# Patient Record
Sex: Male | Born: 1935 | Race: White | Hispanic: No | Smoking: Never smoker
Health system: Southern US, Community
[De-identification: ages and names within clinical notes are randomized; demographics above are authoritative.]

## PROBLEM LIST (undated history)

## (undated) DIAGNOSIS — I2510 Atherosclerotic heart disease of native coronary artery without angina pectoris: Secondary | ICD-10-CM

## (undated) DIAGNOSIS — E119 Type 2 diabetes mellitus without complications: Secondary | ICD-10-CM

## (undated) DIAGNOSIS — I10 Essential (primary) hypertension: Secondary | ICD-10-CM

## (undated) DIAGNOSIS — K219 Gastro-esophageal reflux disease without esophagitis: Secondary | ICD-10-CM

## (undated) DIAGNOSIS — E079 Disorder of thyroid, unspecified: Secondary | ICD-10-CM

## (undated) HISTORY — PX: SHOULDER SURGERY: SHX246

## (undated) HISTORY — PX: TONSILLECTOMY: SUR1361

## (undated) HISTORY — PX: NASAL SINUS SURGERY: SHX719

---

## 2015-05-31 ENCOUNTER — Emergency Department (HOSPITAL_BASED_OUTPATIENT_CLINIC_OR_DEPARTMENT_OTHER): Payer: No Typology Code available for payment source

## 2015-05-31 ENCOUNTER — Encounter (HOSPITAL_BASED_OUTPATIENT_CLINIC_OR_DEPARTMENT_OTHER): Payer: Self-pay | Admitting: Emergency Medicine

## 2015-05-31 ENCOUNTER — Emergency Department (HOSPITAL_BASED_OUTPATIENT_CLINIC_OR_DEPARTMENT_OTHER)
Admission: EM | Admit: 2015-05-31 | Discharge: 2015-05-31 | Disposition: A | Discharge status: Home / Self Care | Payer: No Typology Code available for payment source | Attending: Emergency Medicine | Admitting: Emergency Medicine

## 2015-05-31 DIAGNOSIS — E119 Type 2 diabetes mellitus without complications: Secondary | ICD-10-CM | POA: Insufficient documentation

## 2015-05-31 DIAGNOSIS — S0083XA Contusion of other part of head, initial encounter: Secondary | ICD-10-CM | POA: Diagnosis not present

## 2015-05-31 DIAGNOSIS — S0081XA Abrasion of other part of head, initial encounter: Secondary | ICD-10-CM | POA: Insufficient documentation

## 2015-05-31 DIAGNOSIS — I2510 Atherosclerotic heart disease of native coronary artery without angina pectoris: Secondary | ICD-10-CM | POA: Diagnosis not present

## 2015-05-31 DIAGNOSIS — Z7982 Long term (current) use of aspirin: Secondary | ICD-10-CM | POA: Diagnosis not present

## 2015-05-31 DIAGNOSIS — M7989 Other specified soft tissue disorders: Secondary | ICD-10-CM | POA: Insufficient documentation

## 2015-05-31 DIAGNOSIS — Z7901 Long term (current) use of anticoagulants: Secondary | ICD-10-CM | POA: Insufficient documentation

## 2015-05-31 DIAGNOSIS — S161XXA Strain of muscle, fascia and tendon at neck level, initial encounter: Secondary | ICD-10-CM | POA: Insufficient documentation

## 2015-05-31 DIAGNOSIS — Y998 Other external cause status: Secondary | ICD-10-CM | POA: Insufficient documentation

## 2015-05-31 DIAGNOSIS — S3992XA Unspecified injury of lower back, initial encounter: Secondary | ICD-10-CM | POA: Diagnosis present

## 2015-05-31 DIAGNOSIS — S39012A Strain of muscle, fascia and tendon of lower back, initial encounter: Secondary | ICD-10-CM | POA: Diagnosis not present

## 2015-05-31 DIAGNOSIS — E079 Disorder of thyroid, unspecified: Secondary | ICD-10-CM | POA: Diagnosis not present

## 2015-05-31 DIAGNOSIS — S0990XA Unspecified injury of head, initial encounter: Secondary | ICD-10-CM | POA: Diagnosis not present

## 2015-05-31 DIAGNOSIS — Y9389 Activity, other specified: Secondary | ICD-10-CM | POA: Insufficient documentation

## 2015-05-31 DIAGNOSIS — Y92410 Unspecified street and highway as the place of occurrence of the external cause: Secondary | ICD-10-CM | POA: Diagnosis not present

## 2015-05-31 DIAGNOSIS — I10 Essential (primary) hypertension: Secondary | ICD-10-CM | POA: Diagnosis not present

## 2015-05-31 DIAGNOSIS — Z79899 Other long term (current) drug therapy: Secondary | ICD-10-CM | POA: Diagnosis not present

## 2015-05-31 HISTORY — DX: Atherosclerotic heart disease of native coronary artery without angina pectoris: I25.10

## 2015-05-31 HISTORY — DX: Disorder of thyroid, unspecified: E07.9

## 2015-05-31 HISTORY — DX: Essential (primary) hypertension: I10

## 2015-05-31 HISTORY — DX: Type 2 diabetes mellitus without complications: E11.9

## 2015-05-31 MED ORDER — TRAMADOL HCL 50 MG PO TABS: 50.0000 mg | ORAL_TABLET | Freq: Four times a day (QID) | ORAL | Status: DC | PRN

## 2015-05-31 NOTE — Discharge Instructions (Signed)
Take pain medicine as directed. As we discuss CT scan of head and neck without any acute findings. X-rays of the low back with evidence of a compression fracture whether new or old is not clear however you're not tender over that bone so may very well be old. He continued to have trouble in that area CT scan of the low back could be done by her primary care doctor or by us. Return for any new or worse symptoms.
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
## 2017-02-21 ENCOUNTER — Emergency Department (HOSPITAL_COMMUNITY)
Admission: EM | Admit: 2017-02-21 | Discharge: 2017-02-21 | Disposition: A | Discharge status: Home / Self Care | Payer: Medicare Other | Attending: Emergency Medicine | Admitting: Emergency Medicine

## 2017-02-21 ENCOUNTER — Emergency Department (HOSPITAL_COMMUNITY): Payer: Medicare Other

## 2017-02-21 ENCOUNTER — Encounter (HOSPITAL_COMMUNITY): Payer: Self-pay | Admitting: Emergency Medicine

## 2017-02-21 DIAGNOSIS — R339 Retention of urine, unspecified: Secondary | ICD-10-CM

## 2017-02-21 DIAGNOSIS — Z23 Encounter for immunization: Secondary | ICD-10-CM | POA: Diagnosis not present

## 2017-02-21 DIAGNOSIS — R0789 Other chest pain: Secondary | ICD-10-CM | POA: Diagnosis not present

## 2017-02-21 DIAGNOSIS — R51 Headache: Secondary | ICD-10-CM | POA: Insufficient documentation

## 2017-02-21 DIAGNOSIS — W0110XA Fall on same level from slipping, tripping and stumbling with subsequent striking against unspecified object, initial encounter: Secondary | ICD-10-CM | POA: Insufficient documentation

## 2017-02-21 DIAGNOSIS — E119 Type 2 diabetes mellitus without complications: Secondary | ICD-10-CM | POA: Diagnosis not present

## 2017-02-21 DIAGNOSIS — I2510 Atherosclerotic heart disease of native coronary artery without angina pectoris: Secondary | ICD-10-CM | POA: Diagnosis not present

## 2017-02-21 DIAGNOSIS — Y92012 Bathroom of single-family (private) house as the place of occurrence of the external cause: Secondary | ICD-10-CM | POA: Diagnosis not present

## 2017-02-21 DIAGNOSIS — R109 Unspecified abdominal pain: Secondary | ICD-10-CM | POA: Diagnosis not present

## 2017-02-21 DIAGNOSIS — Z7901 Long term (current) use of anticoagulants: Secondary | ICD-10-CM | POA: Insufficient documentation

## 2017-02-21 DIAGNOSIS — S022XXB Fracture of nasal bones, initial encounter for open fracture: Secondary | ICD-10-CM | POA: Diagnosis not present

## 2017-02-21 DIAGNOSIS — Y939 Activity, unspecified: Secondary | ICD-10-CM | POA: Insufficient documentation

## 2017-02-21 DIAGNOSIS — S12121A Other nondisplaced dens fracture, initial encounter for closed fracture: Secondary | ICD-10-CM | POA: Diagnosis not present

## 2017-02-21 DIAGNOSIS — Z7982 Long term (current) use of aspirin: Secondary | ICD-10-CM | POA: Insufficient documentation

## 2017-02-21 DIAGNOSIS — I10 Essential (primary) hypertension: Secondary | ICD-10-CM | POA: Insufficient documentation

## 2017-02-21 DIAGNOSIS — Z79899 Other long term (current) drug therapy: Secondary | ICD-10-CM | POA: Insufficient documentation

## 2017-02-21 DIAGNOSIS — W19XXXA Unspecified fall, initial encounter: Secondary | ICD-10-CM

## 2017-02-21 DIAGNOSIS — Y999 Unspecified external cause status: Secondary | ICD-10-CM | POA: Insufficient documentation

## 2017-02-21 DIAGNOSIS — S0992XA Unspecified injury of nose, initial encounter: Secondary | ICD-10-CM | POA: Diagnosis present

## 2017-02-21 DIAGNOSIS — S12100A Unspecified displaced fracture of second cervical vertebra, initial encounter for closed fracture: Secondary | ICD-10-CM

## 2017-02-21 LAB — COMPREHENSIVE METABOLIC PANEL
ALBUMIN: 3.9 g/dL (ref 3.5–5.0)
ALT: 29 U/L (ref 17–63)
ANION GAP: 4 — AB (ref 5–15)
AST: 36 U/L (ref 15–41)
Alkaline Phosphatase: 87 U/L (ref 38–126)
BUN: 15 mg/dL (ref 6–20)
CHLORIDE: 95 mmol/L — AB (ref 101–111)
CO2: 32 mmol/L (ref 22–32)
CREATININE: 1.21 mg/dL (ref 0.61–1.24)
Calcium: 9.5 mg/dL (ref 8.9–10.3)
GFR calc non Af Amer: 52 mL/min — ABNORMAL LOW (ref >60)
Glucose, Bld: 197 mg/dL — ABNORMAL HIGH (ref 65–99)
Potassium: 3.9 mmol/L (ref 3.5–5.1)
SODIUM: 131 mmol/L — AB (ref 135–145)
TOTAL PROTEIN: 7.3 g/dL (ref 6.5–8.1)
Total Bilirubin: 0.5 mg/dL (ref 0.3–1.2)

## 2017-02-21 LAB — URINALYSIS, ROUTINE W REFLEX MICROSCOPIC
BACTERIA UA: NONE SEEN
BILIRUBIN URINE: NEGATIVE
Glucose, UA: NEGATIVE mg/dL
KETONES UR: NEGATIVE mg/dL
Leukocytes, UA: NEGATIVE
Nitrite: NEGATIVE
PH: 6 (ref 5.0–8.0)
Protein, ur: NEGATIVE mg/dL
SPECIFIC GRAVITY, URINE: 1.011 (ref 1.005–1.030)
SQUAMOUS EPITHELIAL / LPF: NONE SEEN

## 2017-02-21 LAB — CBC WITH DIFFERENTIAL/PLATELET
BASOS PCT: 0 %
Basophils Absolute: 0 10*3/uL (ref 0.0–0.1)
EOS ABS: 0.1 10*3/uL (ref 0.0–0.7)
Eosinophils Relative: 1 %
HCT: 34.3 % — ABNORMAL LOW (ref 39.0–52.0)
HEMOGLOBIN: 11.1 g/dL — AB (ref 13.0–17.0)
LYMPHS ABS: 1.1 10*3/uL (ref 0.7–4.0)
Lymphocytes Relative: 10 %
MCH: 30.2 pg (ref 26.0–34.0)
MCHC: 32.4 g/dL (ref 30.0–36.0)
MCV: 93.5 fL (ref 78.0–100.0)
Monocytes Absolute: 1 10*3/uL (ref 0.1–1.0)
Monocytes Relative: 9 %
NEUTROS PCT: 80 %
Neutro Abs: 8.9 10*3/uL — ABNORMAL HIGH (ref 1.7–7.7)
PLATELETS: 228 10*3/uL (ref 150–400)
RBC: 3.67 MIL/uL — AB (ref 4.22–5.81)
RDW: 13.3 % (ref 11.5–15.5)
WBC: 11.1 10*3/uL — AB (ref 4.0–10.5)

## 2017-02-21 LAB — PROTIME-INR
INR: 1.79
PROTHROMBIN TIME: 20.7 s — AB (ref 11.4–15.2)

## 2017-02-21 MED ORDER — HYDROCODONE-ACETAMINOPHEN 5-325 MG PO TABS: 1.0000 | ORAL_TABLET | ORAL | 0 refills | Status: DC | PRN

## 2017-02-21 MED ORDER — LIDOCAINE-EPINEPHRINE (PF) 2 %-1:200000 IJ SOLN
20.0000 mL | Freq: Once | INTRAMUSCULAR | Status: AC
  Administered 2017-02-21: 20 mL
  Filled 2017-02-21: qty 20

## 2017-02-21 MED ORDER — TETANUS-DIPHTH-ACELL PERTUSSIS 5-2.5-18.5 LF-MCG/0.5 IM SUSP
0.5000 mL | Freq: Once | INTRAMUSCULAR | Status: AC
  Administered 2017-02-21: 0.5 mL via INTRAMUSCULAR
  Filled 2017-02-21: qty 0.5

## 2017-02-21 MED ORDER — TRAMADOL HCL 50 MG PO TABS
50.0000 mg | ORAL_TABLET | Freq: Once | ORAL | Status: AC
  Administered 2017-02-21: 50 mg via ORAL
  Filled 2017-02-21: qty 1

## 2017-02-21 MED ORDER — IOPAMIDOL (ISOVUE-300) INJECTION 61%
INTRAVENOUS | Status: AC
  Administered 2017-02-21: 100 mL
  Filled 2017-02-21: qty 100

## 2017-02-21 MED ORDER — CEPHALEXIN 500 MG PO CAPS: 500.0000 mg | ORAL_CAPSULE | Freq: Three times a day (TID) | ORAL | 0 refills | Status: DC

## 2017-02-21 NOTE — ED Provider Notes (Signed)
[facility]-EMERGENCY DEPT Provider Note   CSN: [id number] Arrival date & time: 02/21/17  0446     History   Chief Complaint Chief Complaint  Patient presents with  . Fall  . Epistaxis    HPI Justin Salazar is a 83 y.o. male.  Patient presents with nasal pain and facial pain after fall. He states he went to go to the bathroom and tripped striking his nose on the door jam. Denies loss of consciousness. He does take Coumadin. EMS reports nasal deformity with bleeding from the nares and nasal bridge. Patient also complains of neck pain. He lives by himself. He denies any dizziness or lightheadedness. No chest pain or shortness of breath. No focal weakness, numbness or tingling. No bowel or bladder incontinence. No fever or vomiting. Patient also has skin tear to the left arm. He denies any shortness of breath or vomiting. He states this was a trip and mechanical fall. Denies any preceding dizziness or lightheadedness.   The history is provided by the patient and the EMS personnel.  Fall  Associated symptoms include headaches. Pertinent negatives include no chest pain, no abdominal pain and no shortness of breath.  Epistaxis      Past Medical History:  Diagnosis Date  . Coronary artery disease   . Diabetes mellitus without complication (HCC)   . Hypertension   . Thyroid disease     There are no active problems to display for this patient.   History reviewed. No pertinent surgical history.     Home Medications    Prior to Admission medications   Medication Sig Start Date End Date Taking? Authorizing Provider  amLODipine (NORVASC) 10 MG tablet Take 10 mg by mouth daily.    [provider]  aspirin 81 MG tablet Take 81 mg by mouth daily.    [provider]  doxepin (SINEQUAN) 10 MG capsule Take 10 mg by mouth.    [provider]  glyBURIDE (DIABETA) 5 MG tablet Take 5 mg by mouth daily with breakfast.    [provider]  levothyroxine  (SYNTHROID, LEVOTHROID) 175 MCG tablet Take 175 mcg by mouth daily before breakfast.    [provider]  lipase/protease/amylase (CREON) 12000 UNITS CPEP capsule Take by mouth.    [provider]  losartan-hydrochlorothiazide (HYZAAR) 100-25 MG tablet Take 1 tablet by mouth daily.    [provider]  metFORMIN (GLUCOPHAGE) 500 MG tablet Take by mouth 2 (two) times daily with a meal.    [provider]  nebivolol (BYSTOLIC) 5 MG tablet Take 5 mg by mouth daily.    [provider]  ranitidine (ZANTAC) 150 MG tablet Take 150 mg by mouth 2 (two) times daily.    [provider]  rosuvastatin (CRESTOR) 10 MG tablet Take 10 mg by mouth daily.    [provider]  sertraline (ZOLOFT) 100 MG tablet Take 100 mg by mouth daily.    [provider]  sulfaSALAzine (AZULFIDINE) 500 MG tablet Take 500 mg by mouth 4 (four) times daily.    [provider]  traMADol (ULTRAM) 50 MG tablet Take 1 tablet (50 mg total) by mouth every 6 (six) hours as needed. 05/31/15   Vanetta Mulders, MD  warfarin (COUMADIN) 5 MG tablet Take 5 mg by mouth daily.    [provider]    Family History No family history on file.  Social History Social History  Substance Use Topics  . Smoking status: Never Smoker  . Smokeless tobacco:  Not on file  . Alcohol use No     Allergies   Patient has no known allergies.   Review of Systems Review of Systems  Constitutional: Negative for activity change, appetite change and fever.  HENT: Positive for nosebleeds. Negative for congestion.   Eyes: Negative for visual disturbance.  Respiratory: Negative for cough, chest tightness, shortness of breath and wheezing.   Cardiovascular: Negative for chest pain and palpitations.  Gastrointestinal: Negative for abdominal pain, nausea and vomiting.  Genitourinary: Negative for dysuria, hematuria and urgency.  Musculoskeletal: Negative for arthralgias and  myalgias.  Skin: Positive for wound.  Neurological: Positive for headaches. Negative for dizziness, tremors, syncope and weakness.  Hematological: Negative for adenopathy.  Psychiatric/Behavioral: Negative for agitation and hallucinations. The patient is not hyperactive.    all other systems are negative except as noted in the HPI and PMH.     Physical Exam Updated Vital Signs BP (!) 172/76 (BP Location: Right Arm)   Pulse 75   Temp 98 F (36.7 C) (Oral)   Resp 16   SpO2 95%   Physical Exam  Constitutional: He is oriented to person, place, and time. He appears well-developed and well-nourished. No distress.  HENT:  Head: Normocephalic and atraumatic.  Mouth/Throat: Oropharynx is clear and moist. No oropharyngeal exudate.  2 cm Laceration to bridge of nose with bone shard attached. Dried blood to nares bilaterally Bulge to L nasal septum.  Firm, not fluctuant. No hematoma.  Swelling to forehead, nose, upper lip. Dentition intact. No malocclusion. No trismus  Eyes: Pupils are equal, round, and reactive to light. Conjunctivae and EOM are normal.  Neck: Normal range of motion. Neck supple.  No meningismus.  Cardiovascular: Normal rate, regular rhythm, normal heart sounds and intact distal pulses.   No murmur heard. Pulmonary/Chest: Effort normal and breath sounds normal. No respiratory distress. He exhibits tenderness.  L lateral rib tenderness  Abdominal: Soft. There is no tenderness. There is no rebound and no guarding.  Musculoskeletal: Normal range of motion. He exhibits tenderness. He exhibits no edema.  Large skin tear L upper arm without bony tenderness. No T or L spine tenderness FROM hips without pain  Neurological: He is alert and oriented to person, place, and time. No cranial nerve deficit. He exhibits normal muscle tone. Coordination normal.   5/5 strength throughout. CN 2-12 intact.Equal grip strength.   Skin: Skin is warm.  Psychiatric: He has a normal mood and  affect. His behavior is normal.  Nursing note and vitals reviewed.    ED Treatments / Results  Labs (all labs ordered are listed, but only abnormal results are displayed) Labs Reviewed  CBC WITH DIFFERENTIAL/PLATELET - Abnormal; Notable for the following:       Result Value   WBC 11.1 (*)    RBC 3.67 (*)    Hemoglobin 11.1 (*)    HCT 34.3 (*)    Neutro Abs 8.9 (*)    All other components within normal limits  COMPREHENSIVE METABOLIC PANEL - Abnormal; Notable for the following:    Sodium 131 (*)    Chloride 95 (*)    Glucose, Bld 197 (*)    GFR calc non Af Amer 52 (*)    Anion gap 4 (*)    All other components within normal limits  PROTIME-INR - Abnormal; Notable for the following:    Prothrombin Time 20.7 (*)    All other components within normal limits  URINALYSIS, ROUTINE W REFLEX MICROSCOPIC - Abnormal; Notable for  the following:    Hgb urine dipstick SMALL (*)    All other components within normal limits    EKG  EKG Interpretation  Date/Time:  Friday February 21 2017 08:36:58 EDT Ventricular Rate:  82 PR Interval:    QRS Duration: 99 QT Interval:  380 QTC Calculation: 444 R Axis:   -44 Text Interpretation:  Sinus rhythm Atrial premature complex Prolonged PR interval Left anterior fascicular block Probable anteroseptal infarct, old No previous ECGs available Confirmed by Glynn Octave [phone]) on 02/21/2017 8:40:12 AM Also confirmed by Glynn Octave [phone]), editor Jac Canavan, [place] ([id number])  on 02/21/2017 9:21:52 AM       Radiology Dg Chest 2 View  Result Date: 02/21/2017 CLINICAL DATA:  Trip and fall injury while going to the bathroom during the night. EXAM: CHEST  2 VIEW COMPARISON:  None. FINDINGS: Elevated right hemidiaphragm, probably chronic. The lungs are clear except for minor linear scarring or atelectasis in the posterior base. Pulmonary vasculature is normal. No pleural effusion. Hilar and mediastinal contours are remarkable only for tortuosity of  the aorta and great vessels. IMPRESSION: Linear scarring or atelectasis in the posterior bases. No consolidation or effusion. Electronically Signed   By: Ellery Plunk M.D.   On: 02/21/2017 05:32   Ct Head Wo Contrast  Result Date: 02/21/2017 CLINICAL DATA:  Trip and fall injury while getting up to the bathroom during the night. Anti coagulated. Neck and right upper extremity pain. EXAM: CT HEAD WITHOUT CONTRAST CT MAXILLOFACIAL WITHOUT CONTRAST CT CERVICAL SPINE WITHOUT CONTRAST TECHNIQUE: Multidetector CT imaging of the head, cervical spine, and maxillofacial structures were performed using the standard protocol without intravenous contrast. Multiplanar CT image reconstructions of the cervical spine and maxillofacial structures were also generated. COMPARISON:  05/31/2015 FINDINGS: CT HEAD FINDINGS Brain: There is no intracranial hemorrhage, mass or evidence of acute infarction. There is moderate generalized atrophy. There is moderate chronic microvascular ischemic change. There is no significant extra-axial fluid collection. No acute intracranial findings are evident. Vascular: No hyperdense vessel or unexpected calcification. Skull: Normal. Negative for fracture or focal lesion. Other: None. CT MAXILLOFACIAL FINDINGS Osseous: Comminuted depressed nasal bone fractures with overlying soft tissue swelling. No fracture of the bony orbits. Orbital floors are intact. Mandible and TMJ are intact. Zygomatic arches are intact. Pterygoid plates are intact. Orbits: Negative. No traumatic or inflammatory finding. Sinuses: Moderate membrane thickening on the left. No acute maxillary sinus fracture. Soft tissues: No soft tissue foreign body. CT CERVICAL SPINE FINDINGS Alignment: Slight posterior angulation at an odontoid base fracture. No other traumatic malalignment. Unchanged mild degenerative spondylolisthesis at C3-4. Skull base and vertebrae: Fracture through the base of the odontoid with mild posterior  angulation. No displacement. No other acute fracture. Soft tissues and spinal canal: No prevertebral fluid or swelling. No visible canal hematoma. Disc levels: No traumatic disruption of the disc spaces. Facet articulations are arthritic but intact. Upper chest: No acute findings. Other: None IMPRESSION: 1. No acute intracranial findings. There is moderate generalized atrophy and chronic appearing white matter hypodensities which likely represent small vessel ischemic disease. 2. Comminuted depressed nasal bone fractures. 3. Acute fracture across the base of the odontoid with mild angulation. These results were called by telephone at the time of interpretation on 02/21/2017 at 6:39 am to Dr. Glynn Octave , who verbally acknowledged these results. Electronically Signed   By: Ellery Plunk M.D.   On: 02/21/2017 06:42   Ct Chest W Contrast  Result Date: 02/21/2017 CLINICAL DATA:  Trip and  fall injury while getting up to go to the bathroom tonight. EXAM: CT CHEST, ABDOMEN, AND PELVIS WITH CONTRAST TECHNIQUE: Multidetector CT imaging of the chest, abdomen and pelvis was performed following the standard protocol during bolus administration of intravenous contrast. CONTRAST:  ISOVUE-300 IOPAMIDOL (ISOVUE-300) INJECTION 61% COMPARISON:  None. FINDINGS: CT CHEST FINDINGS Cardiovascular: No intrathoracic vascular injury. Extensive aortic and coronary atherosclerosis. Normal heart size. No pericardial effusion. Mediastinum/Nodes: No mediastinal hematoma. No hilar or mediastinal adenopathy. Lungs/Pleura: No pneumothorax. No effusion. Linear lung base opacities likely represent scarring or atelectasis. Airways are intact and patent. Musculoskeletal: No evidence of acute fracture in the chest. CT ABDOMEN PELVIS FINDINGS Hepatobiliary: No focal liver abnormality is seen. No gallstones, gallbladder wall thickening, or biliary dilatation. Pancreas: Pancreatic head calcifications. Moderate atrophy of the pancreatic  body. No acute findings. Probable chronic pancreatitis. Spleen: No splenic injury or perisplenic hematoma. Adrenals/Urinary Tract: No adrenal hemorrhage. Kidneys are atrophic and hydronephrotic. Moderate ureteral dilatation. Marked urinary bladder distention, up above the level of the umbilicus. Stomach/Bowel: Stomach is within normal limits. Appendix is normal. No evidence of bowel wall thickening, distention, or inflammatory changes. Vascular/Lymphatic: No intra-abdominal vascular injury. The abdominal aorta is heavily calcified but normal in caliber. Reproductive: Unremarkable Other: No peritoneal blood or free air. Musculoskeletal: No evidence of acute fracture. IMPRESSION: 1. No evidence of significant traumatic injury in the chest, abdomen or pelvis. 2. Aortic and coronary atherosclerosis. 3. Chronic pancreatitis. 4. Marked urinary bladder distention, up above the level of the umbilicus. Moderate hydronephrosis and hydroureter may be related to the degree of bladder distention. Electronically Signed   By: Ellery Plunk M.D.   On: 02/21/2017 06:48   Ct Cervical Spine Wo Contrast  Result Date: 02/21/2017 CLINICAL DATA:  Trip and fall injury while getting up to the bathroom during the night. Anti coagulated. Neck and right upper extremity pain. EXAM: CT HEAD WITHOUT CONTRAST CT MAXILLOFACIAL WITHOUT CONTRAST CT CERVICAL SPINE WITHOUT CONTRAST TECHNIQUE: Multidetector CT imaging of the head, cervical spine, and maxillofacial structures were performed using the standard protocol without intravenous contrast. Multiplanar CT image reconstructions of the cervical spine and maxillofacial structures were also generated. COMPARISON:  05/31/2015 FINDINGS: CT HEAD FINDINGS Brain: There is no intracranial hemorrhage, mass or evidence of acute infarction. There is moderate generalized atrophy. There is moderate chronic microvascular ischemic change. There is no significant extra-axial fluid collection. No acute  intracranial findings are evident. Vascular: No hyperdense vessel or unexpected calcification. Skull: Normal. Negative for fracture or focal lesion. Other: None. CT MAXILLOFACIAL FINDINGS Osseous: Comminuted depressed nasal bone fractures with overlying soft tissue swelling. No fracture of the bony orbits. Orbital floors are intact. Mandible and TMJ are intact. Zygomatic arches are intact. Pterygoid plates are intact. Orbits: Negative. No traumatic or inflammatory finding. Sinuses: Moderate membrane thickening on the left. No acute maxillary sinus fracture. Soft tissues: No soft tissue foreign body. CT CERVICAL SPINE FINDINGS Alignment: Slight posterior angulation at an odontoid base fracture. No other traumatic malalignment. Unchanged mild degenerative spondylolisthesis at C3-4. Skull base and vertebrae: Fracture through the base of the odontoid with mild posterior angulation. No displacement. No other acute fracture. Soft tissues and spinal canal: No prevertebral fluid or swelling. No visible canal hematoma. Disc levels: No traumatic disruption of the disc spaces. Facet articulations are arthritic but intact. Upper chest: No acute findings. Other: None IMPRESSION: 1. No acute intracranial findings. There is moderate generalized atrophy and chronic appearing white matter hypodensities which likely represent small vessel ischemic disease. 2.  Comminuted depressed nasal bone fractures. 3. Acute fracture across the base of the odontoid with mild angulation. These results were called by telephone at the time of interpretation on 02/21/2017 at 6:39 am to Dr. Glynn Octave , who verbally acknowledged these results. Electronically Signed   By: Ellery Plunk M.D.   On: 02/21/2017 06:42   Ct Abdomen Pelvis W Contrast  Result Date: 02/21/2017 CLINICAL DATA:  Trip and fall injury while getting up to go to the bathroom tonight. EXAM: CT CHEST, ABDOMEN, AND PELVIS WITH CONTRAST TECHNIQUE: Multidetector CT imaging of the  chest, abdomen and pelvis was performed following the standard protocol during bolus administration of intravenous contrast. CONTRAST:  ISOVUE-300 IOPAMIDOL (ISOVUE-300) INJECTION 61% COMPARISON:  None. FINDINGS: CT CHEST FINDINGS Cardiovascular: No intrathoracic vascular injury. Extensive aortic and coronary atherosclerosis. Normal heart size. No pericardial effusion. Mediastinum/Nodes: No mediastinal hematoma. No hilar or mediastinal adenopathy. Lungs/Pleura: No pneumothorax. No effusion. Linear lung base opacities likely represent scarring or atelectasis. Airways are intact and patent. Musculoskeletal: No evidence of acute fracture in the chest. CT ABDOMEN PELVIS FINDINGS Hepatobiliary: No focal liver abnormality is seen. No gallstones, gallbladder wall thickening, or biliary dilatation. Pancreas: Pancreatic head calcifications. Moderate atrophy of the pancreatic body. No acute findings. Probable chronic pancreatitis. Spleen: No splenic injury or perisplenic hematoma. Adrenals/Urinary Tract: No adrenal hemorrhage. Kidneys are atrophic and hydronephrotic. Moderate ureteral dilatation. Marked urinary bladder distention, up above the level of the umbilicus. Stomach/Bowel: Stomach is within normal limits. Appendix is normal. No evidence of bowel wall thickening, distention, or inflammatory changes. Vascular/Lymphatic: No intra-abdominal vascular injury. The abdominal aorta is heavily calcified but normal in caliber. Reproductive: Unremarkable Other: No peritoneal blood or free air. Musculoskeletal: No evidence of acute fracture. IMPRESSION: 1. No evidence of significant traumatic injury in the chest, abdomen or pelvis. 2. Aortic and coronary atherosclerosis. 3. Chronic pancreatitis. 4. Marked urinary bladder distention, up above the level of the umbilicus. Moderate hydronephrosis and hydroureter may be related to the degree of bladder distention. Electronically Signed   By: Ellery Plunk M.D.   On:  02/21/2017 06:48   Dg Humerus Left  Result Date: 02/21/2017 CLINICAL DATA:  Trip and fall injury while getting up to go to the bathroom during the night. EXAM: LEFT HUMERUS - 2+ VIEW COMPARISON:  None. FINDINGS: Negative for acute fracture or dislocation. No bone lesion or bony destruction. Chronic arthropathic changes at the shoulder. IMPRESSION: Negative for acute fracture or dislocation. Electronically Signed   By: Ellery Plunk M.D.   On: 02/21/2017 05:33   Ct Maxillofacial Wo Contrast  Result Date: 02/21/2017 CLINICAL DATA:  Trip and fall injury while getting up to the bathroom during the night. Anti coagulated. Neck and right upper extremity pain. EXAM: CT HEAD WITHOUT CONTRAST CT MAXILLOFACIAL WITHOUT CONTRAST CT CERVICAL SPINE WITHOUT CONTRAST TECHNIQUE: Multidetector CT imaging of the head, cervical spine, and maxillofacial structures were performed using the standard protocol without intravenous contrast. Multiplanar CT image reconstructions of the cervical spine and maxillofacial structures were also generated. COMPARISON:  05/31/2015 FINDINGS: CT HEAD FINDINGS Brain: There is no intracranial hemorrhage, mass or evidence of acute infarction. There is moderate generalized atrophy. There is moderate chronic microvascular ischemic change. There is no significant extra-axial fluid collection. No acute intracranial findings are evident. Vascular: No hyperdense vessel or unexpected calcification. Skull: Normal. Negative for fracture or focal lesion. Other: None. CT MAXILLOFACIAL FINDINGS Osseous: Comminuted depressed nasal bone fractures with overlying soft tissue swelling. No fracture of the bony orbits.  Orbital floors are intact. Mandible and TMJ are intact. Zygomatic arches are intact. Pterygoid plates are intact. Orbits: Negative. No traumatic or inflammatory finding. Sinuses: Moderate membrane thickening on the left. No acute maxillary sinus fracture. Soft tissues: No soft tissue foreign body.  CT CERVICAL SPINE FINDINGS Alignment: Slight posterior angulation at an odontoid base fracture. No other traumatic malalignment. Unchanged mild degenerative spondylolisthesis at C3-4. Skull base and vertebrae: Fracture through the base of the odontoid with mild posterior angulation. No displacement. No other acute fracture. Soft tissues and spinal canal: No prevertebral fluid or swelling. No visible canal hematoma. Disc levels: No traumatic disruption of the disc spaces. Facet articulations are arthritic but intact. Upper chest: No acute findings. Other: None IMPRESSION: 1. No acute intracranial findings. There is moderate generalized atrophy and chronic appearing white matter hypodensities which likely represent small vessel ischemic disease. 2. Comminuted depressed nasal bone fractures. 3. Acute fracture across the base of the odontoid with mild angulation. These results were called by telephone at the time of interpretation on 02/21/2017 at 6:39 am to Dr. Glynn Octave , who verbally acknowledged these results. Electronically Signed   By: Ellery Plunk M.D.   On: 02/21/2017 06:42    Procedures .Marland KitchenLaceration Repair Date/Time: 02/21/2017 8:23 AM Performed by: Glynn Octave Authorized by: Glynn Octave   Consent:    Consent obtained:  Verbal   Consent given by:  Patient   Risks discussed:  Infection, poor cosmetic result, need for additional repair and retained foreign body Anesthesia (see MAR for exact dosages):    Anesthesia method:  Local infiltration   Local anesthetic:  Lidocaine 2% WITH epi Laceration details:    Location:  Face   Face location:  Nose   Length (cm):  2 Repair type:    Repair type:  Complex Pre-procedure details:    Preparation:  Patient was prepped and draped in usual sterile fashion and imaging obtained to evaluate for foreign bodies Exploration:    Hemostasis achieved with:  Epinephrine   Wound exploration: wound explored through full range of motion      Wound extent: underlying fracture     Contaminated: yes   Treatment:    Area cleansed with:  Saline   Amount of cleaning:  Extensive   Irrigation solution:  Sterile saline   Irrigation method:  Syringe   Visualized foreign bodies/material removed: yes     Debridement:  Minimal   Undermining:  None   Scar revision: no   Skin repair:    Repair method:  Sutures   Suture material:  Chromic gut   Suture technique:  Simple interrupted   Number of sutures:  3 Approximation:    Approximation:  Close Post-procedure details:    Dressing:  Adhesive bandage   Patient tolerance of procedure:  Tolerated well, no immediate complications .Foreign Body Removal Date/Time: 02/21/2017 8:25 AM Performed by: Glynn Octave Authorized by: Glynn Octave  Consent: Verbal consent obtained. Risks and benefits: risks, benefits and alternatives were discussed Consent given by: patient Patient understanding: patient states understanding of the procedure being performed Imaging studies: imaging studies available Patient identity confirmed: verbally with patient Time out: Immediately prior to procedure a "time out" was called to verify the correct patient, procedure, equipment, support staff and site/side marked as required. Body area: nose Anesthesia: local infiltration  Anesthesia: Local Anesthetic: lidocaine 2% with epinephrine Anesthetic total: 5 mL  Sedation: Patient sedated: no Patient restrained: no Patient cooperative: yes Localization method: visualized Removal mechanism: forceps Complexity: simple  1 objects recovered. Objects recovered: bone fragment Post-procedure assessment: foreign body removed Patient tolerance: Patient tolerated the procedure well with no immediate complications   (including critical care time)  Medications Ordered in ED Medications  Tdap (BOOSTRIX) injection 0.5 mL (not administered)     Initial Impression / Assessment and Plan / ED Course  I have  reviewed the triage vital signs and the nursing notes.  Pertinent labs & imaging results that were available during my care of the patient were reviewed by me and considered in my medical decision making (see chart for details).     Patient presents after mechanical fall when he tripped striking his face on the bathroom doorway. Denies any dizziness or lightheadedness. Has laceration to bridge of nose with bone exposed. He is on Coumadin.  He has an apparent nasal fracture and head trauma.  Given rib and abdominal pain, and coumadin use, will obtain CT imaging. Tetanus updated.  Imaging findings noted.  Patient had to urinate when scans obtained and has since voided. Will check bladder scan.  Imaging remarkable for odontoid fracture. Discussed with Dr. Yetta BarreJones of neurosurgery. Recommends c-collar until outpatient follow-up. Open nasal fracture d/w Dr. Adrienne MochaSanger-dillingham.  She recommends removal of shard of bone, laceration repair, sinus precautions and antibiotics.   Wounds repaired.  Labs reassuring.  EKG nsr. Will attempt ambulation and fluid challenge.  Plan [place] in [place] collar and follow up with neurosurgery and facial trauma. Antibiotics given.   Patient's son will be able to stay with him for the next several days. Final Clinical Impressions(s) / ED Diagnoses   Final diagnoses:  Fall, initial encounter  Open displaced fracture of nasal bone, initial encounter  Closed odontoid fracture, initial encounter [facility](HCC)    New Prescriptions New Prescriptions   No medications on file     Glynn Octaveancour, Peggi Yono, MD 02/21/17 [phone]

## 2017-02-21 NOTE — ED Notes (Signed)
Leg bag placed, pt and family verbalized understanding regarding switching from leg back to standard bag.

## 2017-02-21 NOTE — ED Notes (Signed)
Pt placed in [place] collar by MD and Tech.

## 2017-02-21 NOTE — ED Notes (Signed)
Patient transported to X-ray 

## 2017-02-21 NOTE — ED Notes (Signed)
MD Tegeler made aware of patient's output.

## 2017-02-21 NOTE — Discharge Instructions (Addendum)
Wear the cervical collar until you see Dr. Yetta BarreJones in the office. You should follow up with Dr. Kelly SplinterSanger regarding her nasal fracture and have the sutures removed in one week. Keep your head elevated when you sleep. Do not blow your nose. Take the antibiotics as prescribed. Return to the ED if you develop new worsening symptoms including headache, vomiting, confusion or any other concerns.  Please also schedule a follow-up appointment with urology for reassessment of your urinary retention.

## 2017-02-21 NOTE — ED Notes (Signed)
MD at the bedside stitching nose.

## 2017-02-21 NOTE — ED Notes (Signed)
Pt is resting comfortably with family at bedside.  Pt and family member aware of plan of care.  Verbalizes understanding.

## 2017-02-21 NOTE — ED Notes (Signed)
Per Alex from CT, d/t pt's GFR & creatinine levels, he must stay off his metformin for 48hrs after having received contrast dye. Pt informed. Order placed in chart.

## 2017-02-21 NOTE — ED Provider Notes (Signed)
Care assumed from Dr. Manus Gunningancour.   At time of transfer of care, patient is awaiting assessment after ambulation and voiding urine. CT imaging showed concern for urinary retention causing hydronephrosis.   Patient was able to ambulate with his walker as he normally does. Patient to have some neck pain likely secondary to the odontoid fracture discovered. Patient also has nasal bone fractures and laceration that were repaired by previous team. She will be given prescription for antibiotics, Keflex, and discharged. Patient will also be given perception for pain medication to him with his discomfort.  Patient will remain in a cervical immobilization collar and follow-up with his neurosurgery team.   After ambulation, patient had a bladder scan showing 900 mL of urine. Patient was able to void and only produced 100 mL. Given concern for retention remaining, patient will have Foley catheter placed. Patient will follow-up with urology for further management of urinary retention. Patient's CT scan showed no evidence of abnormality in the spine that appears to have caused his retention and patient is denying any low back pain or low back tenderness. Patient understands return precautions and will follow-up with urology and neurosurgery.  Patient's family was advised that they will need to observe and watch patient for the next few days as he is going to be given a low dose prescription for pain medication due to the discomfort is expressing. Patient and family agreed with plan of pain medication administration and close assistance. They understood return precautions.  Patient discharged in good condition     Tegeler, Canary Brimhristopher J, MD 02/21/17 2016

## 2017-02-21 NOTE — ED Notes (Signed)
Pt was able to ambulate with walker as he does at home. Family reports baseline of walking. Pt is alert and oriented x 4

## 2017-02-21 NOTE — ED Triage Notes (Signed)
Pt BIB [facility] for fall. Pt woke up to go to bathroom and tripped falling. Denies LOC. Nasal deformity, bleeding controlled. Pt is on blood thinners. C/o of right shoulder pain and neck pain. Pt lives at home by himself. EMS called by son. Pt is alert in triage. NAD Noted

## 2017-03-04 ENCOUNTER — Ambulatory Visit: Payer: Self-pay | Admitting: Plastic Surgery

## 2017-03-04 DIAGNOSIS — S022XXB Fracture of nasal bones, initial encounter for open fracture: Secondary | ICD-10-CM

## 2017-03-04 NOTE — H&P (Signed)
Justin BeringRobert Salazar is an 83 y.o. male.   Chief Complaint: open nasal fracture HPI: The patient is a 83 y.o. yrs old wm here for a nasal fracture.  He was at home and feel in the bathroom at 2 am striking his nose on the door jam.  A week ago he denies loss of consciousness.  He was taken to the ED by EMS and had a nasal deformity with breathing.  He takes coumadin for CAD and Heart rhythm.  He has mild bruising and swelling of his face with obvious nasal deformity.  He had a portion of the bone that was hanging off and out of the skin according to the ED.  This was removed and the opening sutured closed.  He has a neck brace in place.  He denies any malocclusion.    Past Medical History:  Diagnosis Date  . Coronary artery disease   . Diabetes mellitus without complication (HCC)   . Hypertension   . Thyroid disease     No past surgical history on file.  No family history on file. Social History:  reports that he has never smoked. He does not have any smokeless tobacco history on file. He reports that he does not drink alcohol. His drug history is not on file.  Allergies: No Known Allergies   (Not in a hospital admission)  No results found for this or any previous visit (from the past 48 hour(s)). No results found.  Review of Systems  Constitutional: Negative.   HENT: Negative.   Respiratory: Negative.   Cardiovascular: Negative.   Gastrointestinal: Negative.   Genitourinary: Negative.   Musculoskeletal: Negative.   Skin: Negative.   Neurological: Negative.   Psychiatric/Behavioral: Negative.     There were no vitals taken for this visit. Physical Exam  Constitutional: He is oriented to person, place, and time. He appears well-developed and well-nourished.  HENT:  Head: Normocephalic.  Eyes: Pupils are equal, round, and reactive to light. EOM are normal.  Cardiovascular: Normal rate.   Respiratory: Effort normal.  GI: Soft.  Neurological: He is alert and oriented to person,  place, and time.  Skin: Skin is warm.  Psychiatric: He has a normal mood and affect. His behavior is normal. Judgment and thought content normal.     Assessment/Plan Repair of open nasal fracture  Peggye FormLAIRE S Emberli Ballester, DO 03/04/2017, 1:58 PM

## 2017-03-07 ENCOUNTER — Encounter (HOSPITAL_BASED_OUTPATIENT_CLINIC_OR_DEPARTMENT_OTHER): Payer: Self-pay | Admitting: *Deleted

## 2017-03-07 NOTE — Progress Notes (Signed)
Son Justin Salazar states that Dr Ulice Bold had patient stop Coumadin on 03-04-17 and is now being bridged with Lovenox inj, he is not sure of the dose.

## 2017-03-13 ENCOUNTER — Ambulatory Visit (HOSPITAL_BASED_OUTPATIENT_CLINIC_OR_DEPARTMENT_OTHER): Payer: Medicare Other | Admitting: Anesthesiology

## 2017-03-13 ENCOUNTER — Ambulatory Visit (HOSPITAL_BASED_OUTPATIENT_CLINIC_OR_DEPARTMENT_OTHER)
Admission: RE | Admit: 2017-03-13 | Discharge: 2017-03-13 | Disposition: A | Discharge status: Home / Self Care | Payer: Medicare Other | Source: Ambulatory Visit | Attending: Plastic Surgery | Admitting: Plastic Surgery

## 2017-03-13 ENCOUNTER — Encounter (HOSPITAL_BASED_OUTPATIENT_CLINIC_OR_DEPARTMENT_OTHER)
Admission: RE | Disposition: A | Discharge status: Home / Self Care | Payer: Self-pay | Source: Ambulatory Visit | Attending: Plastic Surgery

## 2017-03-13 ENCOUNTER — Encounter (HOSPITAL_BASED_OUTPATIENT_CLINIC_OR_DEPARTMENT_OTHER): Payer: Self-pay | Admitting: Anesthesiology

## 2017-03-13 DIAGNOSIS — Z7901 Long term (current) use of anticoagulants: Secondary | ICD-10-CM | POA: Insufficient documentation

## 2017-03-13 DIAGNOSIS — Y92019 Unspecified place in single-family (private) house as the place of occurrence of the external cause: Secondary | ICD-10-CM | POA: Diagnosis not present

## 2017-03-13 DIAGNOSIS — Z7982 Long term (current) use of aspirin: Secondary | ICD-10-CM | POA: Diagnosis not present

## 2017-03-13 DIAGNOSIS — S022XXB Fracture of nasal bones, initial encounter for open fracture: Secondary | ICD-10-CM | POA: Diagnosis present

## 2017-03-13 DIAGNOSIS — I10 Essential (primary) hypertension: Secondary | ICD-10-CM | POA: Insufficient documentation

## 2017-03-13 DIAGNOSIS — E119 Type 2 diabetes mellitus without complications: Secondary | ICD-10-CM | POA: Diagnosis not present

## 2017-03-13 DIAGNOSIS — Z79899 Other long term (current) drug therapy: Secondary | ICD-10-CM | POA: Diagnosis not present

## 2017-03-13 DIAGNOSIS — W19XXXA Unspecified fall, initial encounter: Secondary | ICD-10-CM | POA: Insufficient documentation

## 2017-03-13 DIAGNOSIS — I2510 Atherosclerotic heart disease of native coronary artery without angina pectoris: Secondary | ICD-10-CM | POA: Diagnosis not present

## 2017-03-13 HISTORY — DX: Gastro-esophageal reflux disease without esophagitis: K21.9

## 2017-03-13 HISTORY — PX: ORIF NASAL FRACTURE: SHX5359

## 2017-03-13 LAB — POCT I-STAT, CHEM 8
BUN: 17 mg/dL (ref 6–20)
CALCIUM ION: 0.97 mmol/L — AB (ref 1.15–1.40)
CHLORIDE: 84 mmol/L — AB (ref 101–111)
Creatinine, Ser: 0.9 mg/dL (ref 0.61–1.24)
GLUCOSE: 193 mg/dL — AB (ref 65–99)
HCT: 36 % — ABNORMAL LOW (ref 39.0–52.0)
Hemoglobin: 12.2 g/dL — ABNORMAL LOW (ref 13.0–17.0)
Potassium: 3.9 mmol/L (ref 3.5–5.1)
Sodium: 125 mmol/L — ABNORMAL LOW (ref 135–145)
TCO2: 30 mmol/L (ref 22–32)

## 2017-03-13 LAB — GLUCOSE, CAPILLARY: Glucose-Capillary: 181 mg/dL — ABNORMAL HIGH (ref 65–99)

## 2017-03-13 SURGERY — OPEN REDUCTION INTERNAL FIXATION (ORIF) NASAL FRACTURE
Anesthesia: General | Site: Nose

## 2017-03-13 MED ORDER — OXYMETAZOLINE HCL 0.05 % NA SOLN
NASAL | Status: AC
  Filled 2017-03-13: qty 15

## 2017-03-13 MED ORDER — LIDOCAINE-EPINEPHRINE 1 %-1:100000 IJ SOLN
INTRAMUSCULAR | Status: DC | PRN
  Administered 2017-03-13: 1 mL

## 2017-03-13 MED ORDER — SCOPOLAMINE 1 MG/3DAYS TD PT72: 1.0000 | MEDICATED_PATCH | Freq: Once | TRANSDERMAL | Status: DC | PRN

## 2017-03-13 MED ORDER — LIDOCAINE 2% (20 MG/ML) 5 ML SYRINGE
INTRAMUSCULAR | Status: AC
  Filled 2017-03-13: qty 5

## 2017-03-13 MED ORDER — SODIUM CHLORIDE 0.9 % IV SOLN: 250.0000 mL | INTRAVENOUS | Status: DC | PRN

## 2017-03-13 MED ORDER — CEFAZOLIN SODIUM-DEXTROSE 2-4 GM/100ML-% IV SOLN
INTRAVENOUS | Status: AC
  Filled 2017-03-13: qty 100

## 2017-03-13 MED ORDER — OXYCODONE HCL 5 MG PO TABS: 5.0000 mg | ORAL_TABLET | ORAL | Status: DC | PRN

## 2017-03-13 MED ORDER — LACTATED RINGERS IV SOLN
INTRAVENOUS | Status: DC
  Administered 2017-03-13: 12:00:00 via INTRAVENOUS

## 2017-03-13 MED ORDER — ONDANSETRON HCL 4 MG/2ML IJ SOLN
INTRAMUSCULAR | Status: DC | PRN
Start: 2017-03-13 — End: 2017-03-13
  Administered 2017-03-13: 4 mg via INTRAVENOUS

## 2017-03-13 MED ORDER — SUCCINYLCHOLINE CHLORIDE 20 MG/ML IJ SOLN
INTRAMUSCULAR | Status: DC | PRN
  Administered 2017-03-13: 80 mg via INTRAVENOUS

## 2017-03-13 MED ORDER — OXYMETAZOLINE HCL 0.05 % NA SOLN
NASAL | Status: DC | PRN
  Administered 2017-03-13: 1 via TOPICAL

## 2017-03-13 MED ORDER — ONDANSETRON HCL 4 MG/2ML IJ SOLN
INTRAMUSCULAR | Status: AC
  Filled 2017-03-13: qty 2

## 2017-03-13 MED ORDER — OXYCODONE HCL 5 MG PO TABS
ORAL_TABLET | ORAL | Status: AC
  Filled 2017-03-13: qty 1

## 2017-03-13 MED ORDER — SUCCINYLCHOLINE CHLORIDE 200 MG/10ML IV SOSY
PREFILLED_SYRINGE | INTRAVENOUS | Status: AC
  Filled 2017-03-13: qty 10

## 2017-03-13 MED ORDER — SODIUM CHLORIDE 0.9% FLUSH: 3.0000 mL | INTRAVENOUS | Status: DC | PRN

## 2017-03-13 MED ORDER — EPHEDRINE 5 MG/ML INJ
INTRAVENOUS | Status: AC
  Filled 2017-03-13: qty 10

## 2017-03-13 MED ORDER — MIDAZOLAM HCL 2 MG/2ML IJ SOLN: 1.0000 mg | INTRAMUSCULAR | Status: DC | PRN

## 2017-03-13 MED ORDER — CEFAZOLIN SODIUM-DEXTROSE 2-4 GM/100ML-% IV SOLN
2.0000 g | INTRAVENOUS | Status: AC
  Administered 2017-03-13: 2 g via INTRAVENOUS

## 2017-03-13 MED ORDER — PHENYLEPHRINE 40 MCG/ML (10ML) SYRINGE FOR IV PUSH (FOR BLOOD PRESSURE SUPPORT)
PREFILLED_SYRINGE | INTRAVENOUS | Status: AC
  Filled 2017-03-13: qty 10

## 2017-03-13 MED ORDER — PROPOFOL 10 MG/ML IV BOLUS
INTRAVENOUS | Status: DC | PRN
  Administered 2017-03-13: 30 mg via INTRAVENOUS
  Administered 2017-03-13: 100 mg via INTRAVENOUS

## 2017-03-13 MED ORDER — BACITRACIN ZINC 500 UNIT/GM EX OINT
TOPICAL_OINTMENT | CUTANEOUS | Status: AC
  Filled 2017-03-13: qty 28.35

## 2017-03-13 MED ORDER — ACETAMINOPHEN 650 MG RE SUPP: 650.0000 mg | RECTAL | Status: DC | PRN

## 2017-03-13 MED ORDER — FENTANYL CITRATE (PF) 100 MCG/2ML IJ SOLN
50.0000 ug | INTRAMUSCULAR | Status: DC | PRN
  Administered 2017-03-13 (×2): 25 ug via INTRAVENOUS

## 2017-03-13 MED ORDER — ACETAMINOPHEN 325 MG PO TABS: 650.0000 mg | ORAL_TABLET | ORAL | Status: DC | PRN

## 2017-03-13 MED ORDER — LIDOCAINE HCL (CARDIAC) 20 MG/ML IV SOLN
INTRAVENOUS | Status: DC | PRN
  Administered 2017-03-13: 60 mg via INTRAVENOUS

## 2017-03-13 MED ORDER — DEXAMETHASONE SODIUM PHOSPHATE 10 MG/ML IJ SOLN
INTRAMUSCULAR | Status: AC
  Filled 2017-03-13: qty 1

## 2017-03-13 MED ORDER — BACITRACIN-NEOMYCIN-POLYMYXIN 400-5-5000 EX OINT
TOPICAL_OINTMENT | CUTANEOUS | Status: DC | PRN
  Administered 2017-03-13: 1 via TOPICAL

## 2017-03-13 MED ORDER — LIDOCAINE-EPINEPHRINE 1 %-1:100000 IJ SOLN
INTRAMUSCULAR | Status: AC
  Filled 2017-03-13: qty 1

## 2017-03-13 MED ORDER — DEXAMETHASONE SODIUM PHOSPHATE 4 MG/ML IJ SOLN
INTRAMUSCULAR | Status: DC | PRN
  Administered 2017-03-13: 5 mg via INTRAVENOUS

## 2017-03-13 MED ORDER — SODIUM CHLORIDE 0.9% FLUSH: 3.0000 mL | Freq: Two times a day (BID) | INTRAVENOUS | Status: DC

## 2017-03-13 MED ORDER — FENTANYL CITRATE (PF) 100 MCG/2ML IJ SOLN
INTRAMUSCULAR | Status: AC
  Filled 2017-03-13: qty 2

## 2017-03-13 SURGICAL SUPPLY — 16 items
BENZOIN TINCTURE PRP APPL 2/3 (GAUZE/BANDAGES/DRESSINGS) ×3 IMPLANT
CANISTER SUCT 1200ML W/VALVE (MISCELLANEOUS) ×3 IMPLANT
ELECT REM PT RETURN 9FT ADLT (ELECTROSURGICAL) ×3
ELECTRODE REM PT RTRN 9FT ADLT (ELECTROSURGICAL) ×1 IMPLANT
GLOVE BIO SURGEON STRL SZ 6.5 (GLOVE) ×2 IMPLANT
GLOVE BIO SURGEONS STRL SZ 6.5 (GLOVE) ×1
GOWN STRL REUS W/ TWL LRG LVL3 (GOWN DISPOSABLE) ×1 IMPLANT
GOWN STRL REUS W/TWL LRG LVL3 (GOWN DISPOSABLE) ×2
KIT SPLINT NASAL DENVER LRG BE (GAUZE/BANDAGES/DRESSINGS) ×3 IMPLANT
PACK BASIN DAY SURGERY FS (CUSTOM PROCEDURE TRAY) ×3 IMPLANT
PACK ENT DAY SURGERY (CUSTOM PROCEDURE TRAY) ×3 IMPLANT
PATTIES SURGICAL .5 X3 (DISPOSABLE) ×3 IMPLANT
SPLINT NASAL AIRWAY SILICONE (MISCELLANEOUS) ×3 IMPLANT
SUT PROLENE 3 0 PS 2 (SUTURE) ×3 IMPLANT
TOWEL OR 17X24 6PK STRL BLUE (TOWEL DISPOSABLE) ×3 IMPLANT
TRAY DSU PREP LF (CUSTOM PROCEDURE TRAY) ×3 IMPLANT

## 2017-03-13 NOTE — Op Note (Signed)
  Operative Note   DATE OF OPERATION: 03/13/2017  LOCATION:  [facility]   SURGICAL DIVISION: Plastic Surgery  PREOPERATIVE DIAGNOSES:  Septal - Nasal fracture  POSTOPERATIVE DIAGNOSES:  same  PROCEDURE:  Closed septal-nasal fracture reduction with internal and external splint placement.  SURGEON: Claire Sanger Dillingham, DO  ASSISTANT: Shawn Rayburn, PA  ANESTHESIA:  General.   COMPLICATIONS: None.   INDICATIONS FOR PROCEDURE:  The patient, Justin Salazar is a 83 y.o. male born on 08/28/1926, is here for treatment of an open nasal fracture from ~ 2 weeks ago when he fell at home.  The skin was repaired in the ED. MRN: [id number]  CONSENT:  Informed consent was obtained directly from the patient. Risks, benefits and alternatives were fully discussed. Specific risks including but not limited to bleeding, infection, hematoma, seroma, scarring, pain, infection, contracture, asymmetry, wound healing problems, and need for further surgery were all discussed. The patient did have an ample opportunity to have questions answered to satisfaction.   DESCRIPTION OF PROCEDURE:   The patient was taken to the operating room. SCDs were placed and IV antibiotics were given. The patient's operative site was prepped and draped in a sterile fashion. A time out was performed and all information was confirmed to be correct.  General anesthesia was administered.  The afrin soaked pledgets were placed in the nose.  The septum was injected with local.  After waiting several minutes for the vasoconstriction the speculum was placed in the nose and the septum was realigned to the right.  The nasal fracture was reset to the left.  The septal splints were placed and secured with a 4-0 Prolene.  Steri strips were to the nose.  The nasal splint was placed.  The posterior pharynx was suctioned to remove the blood.  The patient tolerated the procedure well.  There were no complications. The  patient was allowed to wake from anesthesia, extubated and taken to the recovery room in satisfactory condition.

## 2017-03-13 NOTE — Discharge Instructions (Signed)
°  Post Anesthesia Home Care Instructions  Activity: Get plenty of rest for the remainder of the day. A responsible individual must stay with you for 24 hours following the procedure.  For the next 24 hours, DO NOT: -Drive a car -Advertising copywriter -Drink alcoholic beverages -Take any medication unless instructed by your physician -Make any legal decisions or sign important papers.  Meals: Start with liquid foods such as gelatin or soup. Progress to regular foods as tolerated. Avoid greasy, spicy, heavy foods. If nausea and/or vomiting occur, drink only clear liquids until the nausea and/or vomiting subsides. Call your physician if vomiting continues.  Special Instructions/Symptoms: Your throat may feel dry or sore from the anesthesia or the breathing tube placed in your throat during surgery. If this causes discomfort, gargle with warm salt water. The discomfort should disappear within 24 hours.  If you had a scopolamine patch placed behind your ear for the management of post- operative nausea and/or vomiting:  1. The medication in the patch is effective for 72 hours, after which it should be removed.  Wrap patch in a tissue and discard in the trash. Wash hands thoroughly with soap and water. 2. You may remove the patch earlier than 72 hours if you experience unpleasant side effects which may include dry mouth, dizziness or visual disturbances. 3. Avoid touching the patch. Wash your hands with soap and water after contact with the patch.   Keep splint in place Don't blow nose Head of bed elevated as able

## 2017-03-13 NOTE — Anesthesia Postprocedure Evaluation (Signed)
Anesthesia Post Note  Patient: Priyansh Pry Tow  Procedure(s) Performed: Procedure(s) (LRB): OPEN REDUCTION INTERNAL FIXATION (ORIF) NASAL FRACTURE WITH INTERAL AND EXTERNAL SPLINTING (N/A)     Patient location during evaluation: PACU Anesthesia Type: General Level of consciousness: sedated and patient cooperative Pain management: pain level controlled Vital Signs Assessment: post-procedure vital signs reviewed and stable Respiratory status: spontaneous breathing Cardiovascular status: stable Anesthetic complications: no    Last Vitals:  Vitals:   03/13/17 1347 03/13/17 1406  BP: (!) 134/51 130/60  Pulse: 62 69  Resp: 16 18  Temp: 36.9 C 36.4 C  SpO2: 92% 96%    Last Pain:  Vitals:   03/13/17 1406  TempSrc:   PainSc: 5                  Lewie Loron

## 2017-03-13 NOTE — H&P (View-Only) (Signed)
Justin Salazar is an 83 y.o. male.   Chief Complaint: open nasal fracture HPI: The patient is a 83 y.o. yrs old wm here for a nasal fracture.  He was at home and feel in the bathroom at 2 am striking his nose on the door jam.  A week ago he denies loss of consciousness.  He was taken to the ED by EMS and had a nasal deformity with breathing.  He takes coumadin for CAD and Heart rhythm.  He has mild bruising and swelling of his face with obvious nasal deformity.  He had a portion of the bone that was hanging off and out of the skin according to the ED.  This was removed and the opening sutured closed.  He has a neck brace in place.  He denies any malocclusion.    Past Medical History:  Diagnosis Date  . Coronary artery disease   . Diabetes mellitus without complication (HCC)   . Hypertension   . Thyroid disease     No past surgical history on file.  No family history on file. Social History:  reports that he has never smoked. He does not have any smokeless tobacco history on file. He reports that he does not drink alcohol. His drug history is not on file.  Allergies: No Known Allergies   (Not in a hospital admission)  No results found for this or any previous visit (from the past 48 hour(s)). No results found.  Review of Systems  Constitutional: Negative.   HENT: Negative.   Respiratory: Negative.   Cardiovascular: Negative.   Gastrointestinal: Negative.   Genitourinary: Negative.   Musculoskeletal: Negative.   Skin: Negative.   Neurological: Negative.   Psychiatric/Behavioral: Negative.     There were no vitals taken for this visit. Physical Exam  Constitutional: He is oriented to person, place, and time. He appears well-developed and well-nourished.  HENT:  Head: Normocephalic.  Eyes: Pupils are equal, round, and reactive to light. EOM are normal.  Cardiovascular: Normal rate.   Respiratory: Effort normal.  GI: Soft.  Neurological: He is alert and oriented to person,  place, and time.  Skin: Skin is warm.  Psychiatric: He has a normal mood and affect. His behavior is normal. Judgment and thought content normal.     Assessment/Plan Repair of open nasal fracture  Jalexa Pifer S Abriel Geesey, DO 03/04/2017, 1:58 PM   

## 2017-03-13 NOTE — Anesthesia Preprocedure Evaluation (Signed)
Anesthesia Evaluation  Patient identified by MRN, date of birth, ID band Patient awake    Reviewed: Allergy & Precautions, NPO status , Patient's Chart, lab work & pertinent test results, reviewed documented beta blocker date and time   Airway Mallampati: II  TM Distance: >3 FB Neck ROM: Full    Dental no notable dental hx.    Pulmonary neg pulmonary ROS,    Pulmonary exam normal breath sounds clear to auscultation       Cardiovascular hypertension, Pt. on medications and Pt. on home beta blockers + CAD  Normal cardiovascular exam Rhythm:Regular Rate:Normal     Neuro/Psych negative neurological ROS  negative psych ROS   GI/Hepatic Neg liver ROS, GERD  ,  Endo/Other  diabetes, Type 2  Renal/GU negative Renal ROS     Musculoskeletal negative musculoskeletal ROS (+)   Abdominal   Peds  Hematology negative hematology ROS (+)   Anesthesia Other Findings   Reproductive/Obstetrics                             Anesthesia Physical Anesthesia Plan  ASA: III  Anesthesia Plan: General   Post-op Pain Management:    Induction: Intravenous  PONV Risk Score and Plan: 3 and Ondansetron, Dexamethasone and Treatment may vary due to age or medical condition  Airway Management Planned: Oral ETT  Additional Equipment:   Intra-op Plan:   Post-operative Plan: Extubation in OR  Informed Consent: I have reviewed the patients History and Physical, chart, labs and discussed the procedure including the risks, benefits and alternatives for the proposed anesthesia with the patient or authorized representative who has indicated his/her understanding and acceptance.   Dental advisory given  Plan Discussed with: CRNA  Anesthesia Plan Comments:         Anesthesia Quick Evaluation

## 2017-03-13 NOTE — Interval H&P Note (Signed)
History and Physical Interval Note:  03/13/2017 11:32 AM  Justin Salazar  has presented today for surgery, with the diagnosis of open fracture of nasal bone  The various methods of treatment have been discussed with the patient and family. After consideration of risks, benefits and other options for treatment, the patient has consented to  Procedure(s): OPEN REDUCTION INTERNAL FIXATION (ORIF) NASAL FRACTURE WITH INTERAL AND EXTERNAL SPLINTING (N/A) as a surgical intervention .  The patient's history has been reviewed, patient examined, no change in status, stable for surgery.  I have reviewed the patient's chart and labs.  Questions were answered to the patient's satisfaction.     Peggye Form

## 2017-03-13 NOTE — Anesthesia Procedure Notes (Signed)
Procedure Name: Intubation Date/Time: 03/13/2017 12:04 PM Performed by: Burna Cash Pre-anesthesia Checklist: Patient identified, Emergency Drugs available, Suction available and Patient being monitored Patient Re-evaluated:Patient Re-evaluated prior to induction Oxygen Delivery Method: Circle system utilized Preoxygenation: Pre-oxygenation with 100% oxygen Induction Type: IV induction Ventilation: Mask ventilation without difficulty Tube type: Oral Tube size: 7.0 mm Number of attempts: 1 Airway Equipment and Method: Stylet,  Oral airway and Video-laryngoscopy Placement Confirmation: ETT inserted through vocal cords under direct vision,  positive ETCO2 and breath sounds checked- equal and bilateral Secured at: 22 cm Tube secured with: Tape Dental Injury: Teeth and Oropharynx as per pre-operative assessment

## 2017-03-13 NOTE — Transfer of Care (Signed)
Immediate Anesthesia Transfer of Care Note  Patient: Justin Salazar  Procedure(s) Performed: Procedure(s): OPEN REDUCTION INTERNAL FIXATION (ORIF) NASAL FRACTURE WITH INTERAL AND EXTERNAL SPLINTING (N/A)  Patient Location: PACU  Anesthesia Type:General  Level of Consciousness: sedated  Airway & Oxygen Therapy: Patient Spontanous Breathing and Patient connected to face mask oxygen  Post-op Assessment: Report given to RN and Post -op Vital signs reviewed and stable  Post vital signs: Reviewed and stable  Last Vitals:  Vitals:   03/13/17 1118  BP: 130/60  Pulse: 69  Resp: 16  Temp: 36.4 C  SpO2: 96%    Last Pain:  Vitals:   03/13/17 1118  TempSrc: Oral         Complications: No apparent anesthesia complications

## 2017-03-14 ENCOUNTER — Encounter (HOSPITAL_BASED_OUTPATIENT_CLINIC_OR_DEPARTMENT_OTHER): Payer: Self-pay | Admitting: Plastic Surgery

## 2017-04-30 ENCOUNTER — Other Ambulatory Visit: Payer: Self-pay | Admitting: Neurological Surgery

## 2017-04-30 DIAGNOSIS — S12100A Unspecified displaced fracture of second cervical vertebra, initial encounter for closed fracture: Secondary | ICD-10-CM

## 2017-05-08 ENCOUNTER — Inpatient Hospital Stay (HOSPITAL_COMMUNITY)
Admission: EM | Admit: 2017-05-08 | Discharge: 2017-05-11 | DRG: 689 | Disposition: A | Discharge status: Home Health | Payer: Medicare Other | Attending: Internal Medicine | Admitting: Internal Medicine

## 2017-05-08 ENCOUNTER — Other Ambulatory Visit: Payer: Self-pay

## 2017-05-08 ENCOUNTER — Encounter (HOSPITAL_COMMUNITY): Payer: Self-pay | Admitting: *Deleted

## 2017-05-08 DIAGNOSIS — N390 Urinary tract infection, site not specified: Secondary | ICD-10-CM | POA: Diagnosis not present

## 2017-05-08 DIAGNOSIS — N320 Bladder-neck obstruction: Secondary | ICD-10-CM | POA: Diagnosis present

## 2017-05-08 DIAGNOSIS — K51911 Ulcerative colitis, unspecified with rectal bleeding: Secondary | ICD-10-CM | POA: Diagnosis present

## 2017-05-08 DIAGNOSIS — I2510 Atherosclerotic heart disease of native coronary artery without angina pectoris: Secondary | ICD-10-CM | POA: Diagnosis present

## 2017-05-08 DIAGNOSIS — N179 Acute kidney failure, unspecified: Secondary | ICD-10-CM | POA: Diagnosis present

## 2017-05-08 DIAGNOSIS — K219 Gastro-esophageal reflux disease without esophagitis: Secondary | ICD-10-CM | POA: Diagnosis present

## 2017-05-08 DIAGNOSIS — N401 Enlarged prostate with lower urinary tract symptoms: Secondary | ICD-10-CM | POA: Diagnosis present

## 2017-05-08 DIAGNOSIS — D500 Iron deficiency anemia secondary to blood loss (chronic): Secondary | ICD-10-CM

## 2017-05-08 DIAGNOSIS — E785 Hyperlipidemia, unspecified: Secondary | ICD-10-CM | POA: Diagnosis present

## 2017-05-08 DIAGNOSIS — R319 Hematuria, unspecified: Secondary | ICD-10-CM | POA: Diagnosis present

## 2017-05-08 DIAGNOSIS — Z7901 Long term (current) use of anticoagulants: Secondary | ICD-10-CM

## 2017-05-08 DIAGNOSIS — I10 Essential (primary) hypertension: Secondary | ICD-10-CM | POA: Diagnosis present

## 2017-05-08 DIAGNOSIS — Z7984 Long term (current) use of oral hypoglycemic drugs: Secondary | ICD-10-CM

## 2017-05-08 DIAGNOSIS — Y92009 Unspecified place in unspecified non-institutional (private) residence as the place of occurrence of the external cause: Secondary | ICD-10-CM

## 2017-05-08 DIAGNOSIS — E118 Type 2 diabetes mellitus with unspecified complications: Secondary | ICD-10-CM

## 2017-05-08 DIAGNOSIS — R339 Retention of urine, unspecified: Secondary | ICD-10-CM | POA: Diagnosis present

## 2017-05-08 DIAGNOSIS — E876 Hypokalemia: Secondary | ICD-10-CM | POA: Diagnosis present

## 2017-05-08 DIAGNOSIS — W19XXXA Unspecified fall, initial encounter: Secondary | ICD-10-CM | POA: Diagnosis present

## 2017-05-08 DIAGNOSIS — Z7982 Long term (current) use of aspirin: Secondary | ICD-10-CM

## 2017-05-08 DIAGNOSIS — R627 Adult failure to thrive: Secondary | ICD-10-CM | POA: Diagnosis present

## 2017-05-08 DIAGNOSIS — S022XXA Fracture of nasal bones, initial encounter for closed fracture: Secondary | ICD-10-CM | POA: Diagnosis present

## 2017-05-08 DIAGNOSIS — Z8673 Personal history of transient ischemic attack (TIA), and cerebral infarction without residual deficits: Secondary | ICD-10-CM

## 2017-05-08 DIAGNOSIS — K922 Gastrointestinal hemorrhage, unspecified: Secondary | ICD-10-CM | POA: Diagnosis present

## 2017-05-08 DIAGNOSIS — D62 Acute posthemorrhagic anemia: Secondary | ICD-10-CM | POA: Diagnosis present

## 2017-05-08 DIAGNOSIS — R338 Other retention of urine: Secondary | ICD-10-CM | POA: Diagnosis present

## 2017-05-08 DIAGNOSIS — R310 Gross hematuria: Secondary | ICD-10-CM | POA: Diagnosis present

## 2017-05-08 DIAGNOSIS — I480 Paroxysmal atrial fibrillation: Secondary | ICD-10-CM | POA: Diagnosis present

## 2017-05-08 DIAGNOSIS — N136 Pyonephrosis: Secondary | ICD-10-CM | POA: Diagnosis present

## 2017-05-08 DIAGNOSIS — E039 Hypothyroidism, unspecified: Secondary | ICD-10-CM | POA: Diagnosis present

## 2017-05-08 DIAGNOSIS — N170 Acute kidney failure with tubular necrosis: Secondary | ICD-10-CM

## 2017-05-08 DIAGNOSIS — Z79899 Other long term (current) drug therapy: Secondary | ICD-10-CM

## 2017-05-08 DIAGNOSIS — R296 Repeated falls: Secondary | ICD-10-CM | POA: Diagnosis present

## 2017-05-08 DIAGNOSIS — S129XXA Fracture of neck, unspecified, initial encounter: Secondary | ICD-10-CM | POA: Diagnosis present

## 2017-05-08 DIAGNOSIS — E119 Type 2 diabetes mellitus without complications: Secondary | ICD-10-CM

## 2017-05-08 LAB — URINALYSIS, ROUTINE W REFLEX MICROSCOPIC
BILIRUBIN URINE: NEGATIVE
GLUCOSE, UA: 50 mg/dL — AB
KETONES UR: NEGATIVE mg/dL
NITRITE: POSITIVE — AB
PH: 5 (ref 5.0–8.0)
Protein, ur: 100 mg/dL — AB
Specific Gravity, Urine: 1.014 (ref 1.005–1.030)
Squamous Epithelial / LPF: NONE SEEN

## 2017-05-08 NOTE — ED Triage Notes (Signed)
Pt has back pain more located on rt side, fell a couple of weeks ago and fractured a vertebrae. Yesterday noticed blood in urine.

## 2017-05-09 ENCOUNTER — Encounter (HOSPITAL_COMMUNITY): Payer: Self-pay | Admitting: Emergency Medicine

## 2017-05-09 ENCOUNTER — Emergency Department (HOSPITAL_COMMUNITY): Payer: Medicare Other

## 2017-05-09 DIAGNOSIS — N179 Acute kidney failure, unspecified: Secondary | ICD-10-CM | POA: Diagnosis present

## 2017-05-09 DIAGNOSIS — R339 Retention of urine, unspecified: Secondary | ICD-10-CM | POA: Diagnosis present

## 2017-05-09 DIAGNOSIS — E039 Hypothyroidism, unspecified: Secondary | ICD-10-CM | POA: Diagnosis present

## 2017-05-09 DIAGNOSIS — S022XXA Fracture of nasal bones, initial encounter for closed fracture: Secondary | ICD-10-CM | POA: Diagnosis present

## 2017-05-09 DIAGNOSIS — K219 Gastro-esophageal reflux disease without esophagitis: Secondary | ICD-10-CM | POA: Diagnosis present

## 2017-05-09 DIAGNOSIS — R319 Hematuria, unspecified: Secondary | ICD-10-CM | POA: Diagnosis present

## 2017-05-09 DIAGNOSIS — I10 Essential (primary) hypertension: Secondary | ICD-10-CM | POA: Diagnosis present

## 2017-05-09 DIAGNOSIS — Z7901 Long term (current) use of anticoagulants: Secondary | ICD-10-CM

## 2017-05-09 DIAGNOSIS — N390 Urinary tract infection, site not specified: Secondary | ICD-10-CM | POA: Diagnosis present

## 2017-05-09 DIAGNOSIS — I480 Paroxysmal atrial fibrillation: Secondary | ICD-10-CM | POA: Diagnosis present

## 2017-05-09 DIAGNOSIS — Z8673 Personal history of transient ischemic attack (TIA), and cerebral infarction without residual deficits: Secondary | ICD-10-CM | POA: Diagnosis not present

## 2017-05-09 DIAGNOSIS — K51911 Ulcerative colitis, unspecified with rectal bleeding: Secondary | ICD-10-CM | POA: Diagnosis present

## 2017-05-09 DIAGNOSIS — E119 Type 2 diabetes mellitus without complications: Secondary | ICD-10-CM | POA: Diagnosis present

## 2017-05-09 DIAGNOSIS — W19XXXA Unspecified fall, initial encounter: Secondary | ICD-10-CM | POA: Diagnosis present

## 2017-05-09 DIAGNOSIS — D62 Acute posthemorrhagic anemia: Secondary | ICD-10-CM | POA: Diagnosis present

## 2017-05-09 DIAGNOSIS — Y92009 Unspecified place in unspecified non-institutional (private) residence as the place of occurrence of the external cause: Secondary | ICD-10-CM | POA: Diagnosis not present

## 2017-05-09 DIAGNOSIS — N320 Bladder-neck obstruction: Secondary | ICD-10-CM | POA: Diagnosis present

## 2017-05-09 DIAGNOSIS — S129XXA Fracture of neck, unspecified, initial encounter: Secondary | ICD-10-CM | POA: Diagnosis present

## 2017-05-09 DIAGNOSIS — K922 Gastrointestinal hemorrhage, unspecified: Secondary | ICD-10-CM | POA: Diagnosis not present

## 2017-05-09 DIAGNOSIS — E876 Hypokalemia: Secondary | ICD-10-CM | POA: Diagnosis present

## 2017-05-09 DIAGNOSIS — R338 Other retention of urine: Secondary | ICD-10-CM | POA: Diagnosis not present

## 2017-05-09 DIAGNOSIS — I2510 Atherosclerotic heart disease of native coronary artery without angina pectoris: Secondary | ICD-10-CM | POA: Diagnosis present

## 2017-05-09 DIAGNOSIS — E785 Hyperlipidemia, unspecified: Secondary | ICD-10-CM | POA: Diagnosis present

## 2017-05-09 DIAGNOSIS — E118 Type 2 diabetes mellitus with unspecified complications: Secondary | ICD-10-CM | POA: Diagnosis not present

## 2017-05-09 DIAGNOSIS — R627 Adult failure to thrive: Secondary | ICD-10-CM | POA: Diagnosis present

## 2017-05-09 DIAGNOSIS — R310 Gross hematuria: Secondary | ICD-10-CM | POA: Diagnosis present

## 2017-05-09 DIAGNOSIS — N170 Acute kidney failure with tubular necrosis: Secondary | ICD-10-CM | POA: Diagnosis present

## 2017-05-09 DIAGNOSIS — N136 Pyonephrosis: Secondary | ICD-10-CM | POA: Diagnosis present

## 2017-05-09 DIAGNOSIS — N401 Enlarged prostate with lower urinary tract symptoms: Secondary | ICD-10-CM | POA: Diagnosis present

## 2017-05-09 DIAGNOSIS — R296 Repeated falls: Secondary | ICD-10-CM | POA: Diagnosis present

## 2017-05-09 LAB — CBC
HEMATOCRIT: 23.9 % — AB (ref 39.0–52.0)
HEMATOCRIT: 23.2 % — AB (ref 39.0–52.0)
HEMATOCRIT: 22.6 % — AB (ref 39.0–52.0)
HEMOGLOBIN: 7.8 g/dL — AB (ref 13.0–17.0)
HEMOGLOBIN: 7.8 g/dL — AB (ref 13.0–17.0)
HEMOGLOBIN: 7.6 g/dL — AB (ref 13.0–17.0)
MCH: 30.8 pg (ref 26.0–34.0)
MCH: 31.6 pg (ref 26.0–34.0)
MCH: 31.5 pg (ref 26.0–34.0)
MCHC: 32.6 g/dL (ref 30.0–36.0)
MCHC: 33.6 g/dL (ref 30.0–36.0)
MCHC: 33.6 g/dL (ref 30.0–36.0)
MCV: 94.5 fL (ref 78.0–100.0)
MCV: 93.9 fL (ref 78.0–100.0)
MCV: 93.8 fL (ref 78.0–100.0)
PLATELETS: 239 10*3/uL (ref 150–400)
Platelets: 263 10*3/uL (ref 150–400)
Platelets: 238 10*3/uL (ref 150–400)
RBC: 2.53 MIL/uL — AB (ref 4.22–5.81)
RBC: 2.47 MIL/uL — AB (ref 4.22–5.81)
RBC: 2.41 MIL/uL — ABNORMAL LOW (ref 4.22–5.81)
RDW: 16.5 % — ABNORMAL HIGH (ref 11.5–15.5)
RDW: 16.4 % — ABNORMAL HIGH (ref 11.5–15.5)
RDW: 16.4 % — ABNORMAL HIGH (ref 11.5–15.5)
WBC: 9.2 10*3/uL (ref 4.0–10.5)
WBC: 7.7 10*3/uL (ref 4.0–10.5)
WBC: 6.2 10*3/uL (ref 4.0–10.5)

## 2017-05-09 LAB — TYPE AND SCREEN
ABO/RH(D): O NEG
Antibody Screen: NEGATIVE

## 2017-05-09 LAB — BASIC METABOLIC PANEL
ANION GAP: 9 (ref 5–15)
Anion gap: 11 (ref 5–15)
BUN: 58 mg/dL — ABNORMAL HIGH (ref 6–20)
BUN: 50 mg/dL — AB (ref 6–20)
CALCIUM: 8.2 mg/dL — AB (ref 8.9–10.3)
CALCIUM: 8.1 mg/dL — AB (ref 8.9–10.3)
CO2: 24 mmol/L (ref 22–32)
CO2: 22 mmol/L (ref 22–32)
CREATININE: 2.03 mg/dL — AB (ref 0.61–1.24)
Chloride: 99 mmol/L — ABNORMAL LOW (ref 101–111)
Chloride: 97 mmol/L — ABNORMAL LOW (ref 101–111)
Creatinine, Ser: 2.35 mg/dL — ABNORMAL HIGH (ref 0.61–1.24)
GFR calc Af Amer: 27 mL/min — ABNORMAL LOW (ref >60)
GFR calc Af Amer: 32 mL/min — ABNORMAL LOW (ref >60)
GFR, EST NON AFRICAN AMERICAN: 23 mL/min — AB (ref >60)
GFR, EST NON AFRICAN AMERICAN: 28 mL/min — AB (ref >60)
Glucose, Bld: 195 mg/dL — ABNORMAL HIGH (ref 65–99)
Glucose, Bld: 359 mg/dL — ABNORMAL HIGH (ref 65–99)
POTASSIUM: 3.5 mmol/L (ref 3.5–5.1)
Potassium: 3.8 mmol/L (ref 3.5–5.1)
SODIUM: 130 mmol/L — AB (ref 135–145)
Sodium: 132 mmol/L — ABNORMAL LOW (ref 135–145)

## 2017-05-09 LAB — I-STAT CHEM 8, ED
BUN: 57 mg/dL — AB (ref 6–20)
CALCIUM ION: 1.02 mmol/L — AB (ref 1.15–1.40)
CHLORIDE: 97 mmol/L — AB (ref 101–111)
Creatinine, Ser: 3 mg/dL — ABNORMAL HIGH (ref 0.61–1.24)
GLUCOSE: 204 mg/dL — AB (ref 65–99)
HCT: 24 % — ABNORMAL LOW (ref 39.0–52.0)
Hemoglobin: 8.2 g/dL — ABNORMAL LOW (ref 13.0–17.0)
POTASSIUM: 3.9 mmol/L (ref 3.5–5.1)
Sodium: 132 mmol/L — ABNORMAL LOW (ref 135–145)
TCO2: 22 mmol/L (ref 22–32)

## 2017-05-09 LAB — CBC WITH DIFFERENTIAL/PLATELET
Basophils Absolute: 0 K/uL (ref 0.0–0.1)
Basophils Relative: 0 %
Eosinophils Absolute: 0.1 K/uL (ref 0.0–0.7)
Eosinophils Relative: 1 %
HCT: 23.2 % — ABNORMAL LOW (ref 39.0–52.0)
Hemoglobin: 7.7 g/dL — ABNORMAL LOW (ref 13.0–17.0)
Lymphocytes Relative: 19 %
Lymphs Abs: 1.7 K/uL (ref 0.7–4.0)
MCH: 31.3 pg (ref 26.0–34.0)
MCHC: 33.2 g/dL (ref 30.0–36.0)
MCV: 94.3 fL (ref 78.0–100.0)
Monocytes Absolute: 1.2 K/uL — ABNORMAL HIGH (ref 0.1–1.0)
Monocytes Relative: 14 %
Neutro Abs: 6 K/uL (ref 1.7–7.7)
Neutrophils Relative %: 66 %
Platelets: 267 K/uL (ref 150–400)
RBC: 2.46 MIL/uL — ABNORMAL LOW (ref 4.22–5.81)
RDW: 16.4 % — ABNORMAL HIGH (ref 11.5–15.5)
WBC: 9 K/uL (ref 4.0–10.5)

## 2017-05-09 LAB — GLUCOSE, CAPILLARY
GLUCOSE-CAPILLARY: 188 mg/dL — AB (ref 65–99)
GLUCOSE-CAPILLARY: 219 mg/dL — AB (ref 65–99)
Glucose-Capillary: 241 mg/dL — ABNORMAL HIGH (ref 65–99)
Glucose-Capillary: 132 mg/dL — ABNORMAL HIGH (ref 65–99)

## 2017-05-09 LAB — PROTIME-INR
INR: 2.77
PROTHROMBIN TIME: 29 s — AB (ref 11.4–15.2)

## 2017-05-09 LAB — ABO/RH: ABO/RH(D): O NEG

## 2017-05-09 LAB — MAGNESIUM: MAGNESIUM: 1.6 mg/dL — AB (ref 1.7–2.4)

## 2017-05-09 LAB — HEMOGLOBIN A1C
Hgb A1c MFr Bld: 6.4 % — ABNORMAL HIGH (ref 4.8–5.6)
MEAN PLASMA GLUCOSE: 136.98 mg/dL

## 2017-05-09 LAB — POC OCCULT BLOOD, ED: Fecal Occult Bld: POSITIVE — AB

## 2017-05-09 MED ORDER — FERROUS SULFATE 325 (65 FE) MG PO TABS
325.0000 mg | ORAL_TABLET | Freq: Every day | ORAL | Status: DC
  Administered 2017-05-09 – 2017-05-11 (×3): 325 mg via ORAL
  Filled 2017-05-09 (×3): qty 1

## 2017-05-09 MED ORDER — MELATONIN 10 MG PO TABS: 10.0000 mg | ORAL_TABLET | Freq: Every day | ORAL | Status: DC

## 2017-05-09 MED ORDER — ACETAMINOPHEN 650 MG RE SUPP: 650.0000 mg | Freq: Four times a day (QID) | RECTAL | Status: DC | PRN

## 2017-05-09 MED ORDER — ONDANSETRON HCL 4 MG PO TABS: 4.0000 mg | ORAL_TABLET | Freq: Four times a day (QID) | ORAL | Status: DC | PRN

## 2017-05-09 MED ORDER — DEXTROSE 5 % IV SOLN
1.0000 g | Freq: Once | INTRAVENOUS | Status: AC
  Administered 2017-05-09: 1 g via INTRAVENOUS
  Filled 2017-05-09: qty 10

## 2017-05-09 MED ORDER — SODIUM CHLORIDE 0.9 % IV BOLUS (SEPSIS)
1000.0000 mL | Freq: Once | INTRAVENOUS | Status: AC
  Administered 2017-05-09: 1000 mL via INTRAVENOUS

## 2017-05-09 MED ORDER — SERTRALINE HCL 50 MG PO TABS
100.0000 mg | ORAL_TABLET | Freq: Every day | ORAL | Status: DC
  Administered 2017-05-09 – 2017-05-11 (×3): 100 mg via ORAL
  Filled 2017-05-09 (×3): qty 2

## 2017-05-09 MED ORDER — INSULIN GLARGINE 100 UNIT/ML ~~LOC~~ SOLN
5.0000 [IU] | Freq: Every day | SUBCUTANEOUS | Status: DC
  Administered 2017-05-09 – 2017-05-10 (×2): 5 [IU] via SUBCUTANEOUS
  Filled 2017-05-09 (×3): qty 0.05

## 2017-05-09 MED ORDER — MAGNESIUM SULFATE 2 GM/50ML IV SOLN
2.0000 g | Freq: Once | INTRAVENOUS | Status: AC
  Administered 2017-05-09: 2 g via INTRAVENOUS
  Filled 2017-05-09: qty 50

## 2017-05-09 MED ORDER — LEVOTHYROXINE SODIUM 25 MCG PO TABS
175.0000 ug | ORAL_TABLET | Freq: Every day | ORAL | Status: DC
  Administered 2017-05-09 – 2017-05-11 (×3): 175 ug via ORAL
  Filled 2017-05-09 (×3): qty 1
  Filled 2017-05-09: qty 3
  Filled 2017-05-09: qty 1

## 2017-05-09 MED ORDER — BOOST / RESOURCE BREEZE PO LIQD
1.0000 | Freq: Three times a day (TID) | ORAL | Status: DC
  Administered 2017-05-09: 1 via ORAL

## 2017-05-09 MED ORDER — PANCRELIPASE (LIP-PROT-AMYL) 36000-114000 UNITS PO CPEP
36000.0000 [IU] | ORAL_CAPSULE | Freq: Three times a day (TID) | ORAL | Status: DC
  Administered 2017-05-09 – 2017-05-11 (×8): 36000 [IU] via ORAL
  Filled 2017-05-09 (×9): qty 1

## 2017-05-09 MED ORDER — ACETAMINOPHEN 500 MG PO TABS
1000.0000 mg | ORAL_TABLET | Freq: Once | ORAL | Status: AC
  Administered 2017-05-09: 1000 mg via ORAL
  Filled 2017-05-09: qty 2

## 2017-05-09 MED ORDER — ATORVASTATIN CALCIUM 20 MG PO TABS
20.0000 mg | ORAL_TABLET | Freq: Every day | ORAL | Status: DC
  Administered 2017-05-09 – 2017-05-10 (×2): 20 mg via ORAL
  Filled 2017-05-09 (×2): qty 1

## 2017-05-09 MED ORDER — INSULIN ASPART 100 UNIT/ML ~~LOC~~ SOLN
0.0000 [IU] | Freq: Three times a day (TID) | SUBCUTANEOUS | Status: DC
  Administered 2017-05-09: 3 [IU] via SUBCUTANEOUS
  Administered 2017-05-09: 2 [IU] via SUBCUTANEOUS
  Administered 2017-05-09: 3 [IU] via SUBCUTANEOUS
  Administered 2017-05-10: 2 [IU] via SUBCUTANEOUS
  Administered 2017-05-10: 3 [IU] via SUBCUTANEOUS
  Administered 2017-05-10: 2 [IU] via SUBCUTANEOUS
  Administered 2017-05-11: 7 [IU] via SUBCUTANEOUS
  Administered 2017-05-11: 3 [IU] via SUBCUTANEOUS

## 2017-05-09 MED ORDER — PANTOPRAZOLE SODIUM 40 MG IV SOLR
40.0000 mg | INTRAVENOUS | Status: DC
  Administered 2017-05-09 – 2017-05-11 (×3): 40 mg via INTRAVENOUS
  Filled 2017-05-09 (×2): qty 40

## 2017-05-09 MED ORDER — CYCLOBENZAPRINE HCL 5 MG PO TABS
5.0000 mg | ORAL_TABLET | Freq: Three times a day (TID) | ORAL | Status: DC | PRN
  Administered 2017-05-09: 5 mg via ORAL
  Filled 2017-05-09: qty 1

## 2017-05-09 MED ORDER — SODIUM CHLORIDE 0.9 % IV SOLN
INTRAVENOUS | Status: AC
  Administered 2017-05-09: 04:00:00 via INTRAVENOUS

## 2017-05-09 MED ORDER — ENSURE ENLIVE PO LIQD
237.0000 mL | Freq: Two times a day (BID) | ORAL | Status: DC
  Administered 2017-05-09 – 2017-05-11 (×4): 237 mL via ORAL

## 2017-05-09 MED ORDER — DOXEPIN HCL 10 MG PO CAPS
10.0000 mg | ORAL_CAPSULE | Freq: Every day | ORAL | Status: DC
  Administered 2017-05-09 – 2017-05-10 (×2): 10 mg via ORAL
  Filled 2017-05-09 (×2): qty 1

## 2017-05-09 MED ORDER — POTASSIUM CHLORIDE CRYS ER 20 MEQ PO TBCR
40.0000 meq | EXTENDED_RELEASE_TABLET | Freq: Once | ORAL | Status: AC
  Administered 2017-05-09: 40 meq via ORAL
  Filled 2017-05-09: qty 2

## 2017-05-09 MED ORDER — SULFASALAZINE 500 MG PO TABS
1000.0000 mg | ORAL_TABLET | Freq: Two times a day (BID) | ORAL | Status: DC
  Administered 2017-05-09 – 2017-05-11 (×5): 1000 mg via ORAL
  Filled 2017-05-09 (×5): qty 2

## 2017-05-09 MED ORDER — GABAPENTIN 100 MG PO CAPS
100.0000 mg | ORAL_CAPSULE | Freq: Every day | ORAL | Status: DC
  Administered 2017-05-09 – 2017-05-10 (×2): 100 mg via ORAL
  Filled 2017-05-09 (×2): qty 1

## 2017-05-09 MED ORDER — HYDRALAZINE HCL 20 MG/ML IJ SOLN: 10.0000 mg | INTRAMUSCULAR | Status: DC | PRN

## 2017-05-09 MED ORDER — ACETAMINOPHEN 325 MG PO TABS
650.0000 mg | ORAL_TABLET | Freq: Four times a day (QID) | ORAL | Status: DC | PRN
  Administered 2017-05-11: 650 mg via ORAL
  Filled 2017-05-09: qty 2

## 2017-05-09 MED ORDER — AMLODIPINE BESYLATE 5 MG PO TABS: 10.0000 mg | ORAL_TABLET | Freq: Every day | ORAL | Status: DC

## 2017-05-09 MED ORDER — OXYBUTYNIN CHLORIDE ER 5 MG PO TB24
10.0000 mg | ORAL_TABLET | Freq: Every day | ORAL | Status: DC
  Administered 2017-05-09 – 2017-05-10 (×3): 10 mg via ORAL
  Filled 2017-05-09: qty 2
  Filled 2017-05-09: qty 1
  Filled 2017-05-09: qty 2

## 2017-05-09 MED ORDER — ONDANSETRON HCL 4 MG/2ML IJ SOLN: 4.0000 mg | Freq: Four times a day (QID) | INTRAMUSCULAR | Status: DC | PRN

## 2017-05-09 MED ORDER — INSULIN ASPART 100 UNIT/ML ~~LOC~~ SOLN
3.0000 [IU] | Freq: Three times a day (TID) | SUBCUTANEOUS | Status: DC
  Administered 2017-05-09 – 2017-05-11 (×6): 3 [IU] via SUBCUTANEOUS

## 2017-05-09 MED ORDER — DEXTROSE 5 % IV SOLN
1.0000 g | INTRAVENOUS | Status: DC
  Administered 2017-05-10 (×2): 1 g via INTRAVENOUS
  Filled 2017-05-09 (×2): qty 10

## 2017-05-09 MED ORDER — METOPROLOL SUCCINATE ER 25 MG PO TB24
25.0000 mg | ORAL_TABLET | Freq: Every day | ORAL | Status: DC
  Administered 2017-05-09 – 2017-05-11 (×3): 25 mg via ORAL
  Filled 2017-05-09 (×3): qty 1

## 2017-05-09 NOTE — Progress Notes (Signed)
Inpatient Diabetes Program Recommendations  AACE/ADA: New Consensus Statement on Inpatient Glycemic Control (2015)  Target Ranges:  Prepandial:   less than 140 mg/dL      Peak postprandial:   less than 180 mg/dL (1-2 hours)      Critically ill patients:  140 - 180 mg/dL   Lab Results  Component Value Date   GLUCAP 219 (H) 05/09/2017   HGBA1C 6.4 (H) 05/09/2017    Review of Glycemic ControlResults for Darra LisDILMORE, Murel B (MRN [id number]) as of 05/09/2017 14:39  Ref. Range 05/09/2017 07:33 05/09/2017 12:01  Glucose-Capillary Latest Ref Range: 65 - 99 mg/dL 469188 (H) 629219 (H)   Diabetes history: DM 2 Outpatient Diabetes medications: Januvia 100 mg daily, Metformin 1000 mg bid Current orders for Inpatient glycemic control:  Novolog sensitive tid with meals   Inpatient Diabetes Program Recommendations:    Based on current BMP, patient will not be able to resume metformin at d/c. While in the hospital, consider adding Lantus 5 units daily.  He should not need insulin at d/c.   Thanks, Beryl MeagerJenny Adoria Kawamoto, RN, BC-ADM Inpatient Diabetes Coordinator Pager [phone] (8a-5p)

## 2017-05-09 NOTE — ED Notes (Addendum)
Please call report to Misty StanleyLisa [phone] at 04:20

## 2017-05-09 NOTE — ED Notes (Signed)
RN going to obtain Protime-INR from IV.

## 2017-05-09 NOTE — Progress Notes (Signed)
Initial Nutrition Assessment  DOCUMENTATION CODES:   Non-severe (moderate) malnutrition in context of acute illness/injury  INTERVENTION:    Monitor for diet advancement/toleration  Ensure Enlive po BID, each supplement provides 350 kcal and 20 grams of protein  NUTRITION DIAGNOSIS:   Moderate Malnutrition related to acute illness(fall resulting in neck/nasal fracture) as evidenced by energy intake < or equal to 50% for > or equal to 1 month, moderate fat depletion, moderate muscle depletion, 10% weight loss in two months.  GOAL:   Patient will meet greater than or equal to 90% of their needs  MONITOR:   PO intake, Supplement acceptance, Weight trends, Labs  REASON FOR ASSESSMENT:   Malnutrition Screening Tool    ASSESSMENT:   Pt with PMH significant for CAD, DM, HTN, and ulcerative colitis. Pt had recent fall resulting in cervical fracture with recent nasal fracture surgery. Presents this admission with acute lower UTI and acute GI bleed.   Spoke with pt at bedside.  Reports having decreased PO intake since 02/2017 after his first fall.  States after 02/2017 his meals went from three meals a day to 1-2. Pt takes creon with every meal. Pt typically consumes one Boost per day at home. Amendable to Ensure this hospital stay.  Pt recently upgraded to FLD (consuming a repoprted 100% of first meal), will change Boost breeze to Ensure. Pt reports losing 30 since 02/2017. Records indicate pt weighed 184 lb 03/13/17 and 165 lb upon this admission.  This shows a 10 % weight loss in two months. Significant for time frame.  Nutrition-Focused physical exam completed. See results below. Pt shown to have neck brace on.   Medications reviewed and include: ferrous sulfate, SSI, creon, NS @ 75 ml/hr, IV abx Labs reviewed: Na 132 (L) BUN 58 (H) Creatinine 2.35 (H) calcium ionized 1.02 (L)   NUTRITION - FOCUSED PHYSICAL EXAM:    Most Recent Value  Orbital Region  No depletion  Upper Arm  Region  Moderate depletion  Thoracic and Lumbar Region  Unable to assess  Buccal Region  Moderate depletion  Temple Region  Moderate depletion  Clavicle Bone Region  Moderate depletion  Clavicle and Acromion Bone Region  Moderate depletion  Scapular Bone Region  Unable to assess  Dorsal Hand  Moderate depletion  Patellar Region  Moderate depletion  Anterior Thigh Region  Moderate depletion  Posterior Calf Region  Moderate depletion  Edema (RD Assessment)  None  Hair  Reviewed  Eyes  Reviewed  Mouth  Reviewed  Skin  Reviewed  Nails  Reviewed      Diet Order:  Diet full liquid Room service appropriate? Yes; Fluid consistency: Thin  EDUCATION NEEDS:   Education needs have been addressed  Skin:  Skin Assessment: Reviewed RN Assessment  Last BM:  05/08/17  Height:   Ht Readings from Last 1 Encounters:  05/09/17 5\' 9"  (1.753 m)    Weight:   Wt Readings from Last 1 Encounters:  05/09/17 165 lb 2 oz (74.9 kg)    Ideal Body Weight:  72.7 kg  BMI:  Body mass index is 24.38 kg/m.  Estimated Nutritional Needs:   Kcal:  2250-2450 kcal/day  Protein:  115-125 g/day  Fluid:  > 2.2 L/day    Vanessa Kickarly Hayven Croy RD, LDN Clinical Nutrition Pager # - [phone]

## 2017-05-09 NOTE — Progress Notes (Signed)
PHARMACIST - PHYSICIAN ORDER COMMUNICATION  CONCERNING: P&T Medication Policy on Herbal Medications  DESCRIPTION:  This patient's order for:  Melatonin  has been noted.  This product(s) is classified as an "herbal" or natural product. Due to a lack of definitive safety studies or FDA approval, nonstandard manufacturing practices, plus the potential risk of unknown drug-drug interactions while on inpatient medications, the Pharmacy and Therapeutics Committee does not permit the use of "herbal" or natural products of this type within [facility].   ACTION TAKEN: The pharmacy department is unable to verify this order at this time and your patient has been informed of this safety policy. Please reevaluate patient's clinical condition at discharge and address if the herbal or natural product(s) should be resumed at that time.  Thanks Lorenza EvangelistGreen, Emsley Custer R 05/09/2017 5:27 AM

## 2017-05-09 NOTE — Progress Notes (Signed)
Pharmacy Antibiotic Note  Justin Salazar is a 83 y.o. male with hx afib on warfarin PTA, presented to the ED on 05/08/2017 with hematuria. CT renal stone showed findings suggestive of severe cystitis.  To start ceftriaxone for UTI.   Plan: - Ceftriaxone 1 gm IV q24h - No renal adjustment is needed with Ceftriaxone -- pharmacy will sign off - Re-consult us if need further assistance  ________________________________________________   Height: 5\' 9"  (175.3 cm) Weight: 165 lb 2 oz (74.9 kg) IBW/kg (Calculated) : 70.7  Temp (24hrs), Avg:98.4 F (36.9 C), Min:98.3 F (36.8 C), Max:98.4 F (36.9 C)  Recent Labs  Lab 05/09/17 0057 05/09/17 0059 05/09/17 0440 05/09/17 0716  WBC 9.0  --  9.2 7.7  CREATININE  --  3.00*  --  2.35*    Estimated Creatinine Clearance: 21.7 mL/min (A) (by C-G formula based on SCr of 2.35 mg/dL (H)).    No Known Allergies   Thank you for allowing pharmacy to be a part of this patient's care.  Lucia Gaskinsham, Odalys Win P 05/09/2017 8:27 AM

## 2017-05-09 NOTE — ED Provider Notes (Signed)
[facility]-EMERGENCY DEPT Provider Note   CSN: [id number] Arrival date & time: 05/08/17  2048     History   Chief Complaint Chief Complaint  Patient presents with  . Hematuria  . Back Pain    HPI Justin Salazar is a 83 y.o. male.  The history is provided by the patient and a relative.  Hematuria  This is a new problem. The current episode started 2 days ago. The problem occurs constantly. The problem has not changed since onset.Pertinent negatives include no chest pain, no abdominal pain, no headaches and no shortness of breath. Nothing aggravates the symptoms. He has tried nothing for the symptoms. The treatment provided no relief.  Back Pain   This is a new problem. The current episode started more than 1 week ago. The problem occurs constantly. The pain is associated with falling. The pain is present in the sacro-iliac joint. The pain does not radiate. The pain is moderate. Pertinent negatives include no chest pain, no fever, no numbness, no headaches, no abdominal pain, no bowel incontinence, no bladder incontinence, no paresthesias, no paresis, no tingling and no weakness. He has tried nothing for the symptoms. The treatment provided no relief.    Past Medical History:  Diagnosis Date  . Coronary artery disease   . Diabetes mellitus without complication (HCC)   . GERD (gastroesophageal reflux disease)   . Hypertension   . Thyroid disease     There are no active problems to display for this patient.   Past Surgical History:  Procedure Laterality Date  . NASAL SINUS SURGERY    . ORIF NASAL FRACTURE N/A 03/13/2017   Procedure: OPEN REDUCTION INTERNAL FIXATION (ORIF) NASAL FRACTURE WITH INTERAL AND EXTERNAL SPLINTING;  Surgeon: Peggye Form, DO;  Location: [facility];  Service: Plastics;  Laterality: N/A;  . SHOULDER SURGERY    . TONSILLECTOMY         Home Medications    Prior to Admission medications   Medication  Sig Start Date End Date Taking? Authorizing Provider  amLODipine (NORVASC) 10 MG tablet Take 10 mg by mouth daily.   Yes [provider]  aspirin 81 MG tablet Take 81 mg by mouth daily.   Yes [provider]  doxepin (SINEQUAN) 10 MG capsule Take 10 mg by mouth at bedtime.    Yes [provider]  gabapentin (NEURONTIN) 100 MG capsule Take 100 mg at bedtime by mouth.  01/28/17  Yes [provider]  ibuprofen (ADVIL,MOTRIN) 200 MG tablet Take 400 mg every 4 (four) hours as needed by mouth for moderate pain.    Yes [provider]  Ibuprofen-Diphenhydramine HCl (IBUPROFEN PM) 200-25 MG CAPS Take 2 tablets at bedtime by mouth.   Yes [provider]  levothyroxine (SYNTHROID, LEVOTHROID) 175 MCG tablet Take 175 mcg by mouth daily before breakfast.   Yes [provider]  lipase/protease/amylase (CREON) 12000 UNITS CPEP capsule Take 36,000 Units by mouth 3 (three) times daily before meals.    Yes [provider]  losartan-hydrochlorothiazide (HYZAAR) 100-25 MG tablet Take 1 tablet every morning by mouth.    Yes [provider]  Melatonin 10 MG TABS Take 10 mg by mouth at bedtime.   Yes [provider]  metFORMIN (GLUCOPHAGE) 500 MG tablet Take 1,000 mg by mouth 2 (two) times daily with a meal.    Yes [provider]  metoprolol succinate (TOPROL-XL) 25 MG 24 hr tablet Take 25 mg by mouth  daily. 11/26/16  Yes [provider]  ranitidine (ZANTAC) 150 MG tablet Take 150 mg by mouth at bedtime.    Yes [provider]  sertraline (ZOLOFT) 100 MG tablet Take 100 mg every morning by mouth.    Yes [provider]  sitaGLIPtin (JANUVIA) 100 MG tablet Take 100 mg at bedtime by mouth.  02/03/17 02/03/18 Yes [provider]  sulfaSALAzine (AZULFIDINE) 500 MG tablet Take 1,000 mg by mouth 2 (two) times daily.    Yes [provider]  warfarin (COUMADIN) 1 MG tablet Take 1 mg every  Monday, Wednesday, and Friday by mouth. Pt takes with the 5mg  to get a total of 6mg    Yes [provider]  warfarin (COUMADIN) 5 MG tablet Take 5-10 mg by mouth See admin instructions. Pt takes 5mg  Tue, Thurs, Sat, Sun - takes 10mg  M, W, F   Yes [provider]  atorvastatin (LIPITOR) 20 MG tablet Take 20 mg by mouth daily. 02/14/17   [provider]  b complex vitamins tablet Take 1 tablet by mouth daily.    [provider]  Cholecalciferol (VITAMIN D3) 1000 units CAPS Take 2,000 Units by mouth daily.     [provider]  HYDROcodone-acetaminophen (NORCO/VICODIN) 5-325 MG tablet Take 1 tablet by mouth every 4 (four) hours as needed. Patient not taking: Reported on 05/09/2017 02/21/17   Tegeler, Canary Brimhristopher J, MD    Family History No family history on file.  Social History Social History   Tobacco Use  . Smoking status: Never Smoker  . Smokeless tobacco: Never Used  Substance Use Topics  . Alcohol use: Yes    Comment: rarely  . Drug use: No     Allergies   Patient has no known allergies.   Review of Systems Review of Systems  Constitutional: Positive for appetite change. Negative for chills and fever.  Respiratory: Negative for shortness of breath.   Cardiovascular: Negative for chest pain and leg swelling.  Gastrointestinal: Negative for abdominal pain and bowel incontinence.  Genitourinary: Positive for hematuria. Negative for bladder incontinence and difficulty urinating.  Musculoskeletal: Positive for back pain.  Neurological: Negative for tingling, weakness, numbness, headaches and paresthesias.  All other systems reviewed and are negative.    Physical Exam Updated Vital Signs BP (!) 151/82 (BP Location: Right Arm)   Pulse 87   Temp 98.3 F (36.8 C) (Oral)   Resp 16   Ht 5\' 9"  (1.753 m)   Wt 74.8 kg (165 lb)   SpO2 99%   BMI 24.37 kg/m   Physical Exam  Constitutional: He is oriented to person, place, and time. He  appears well-developed and well-nourished.  HENT:  Head: Normocephalic and atraumatic.  Nose: Nose normal.  Mouth/Throat: No oropharyngeal exudate.  Eyes: Pupils are equal, round, and reactive to light.  Neck: Normal range of motion. Neck supple. No tracheal deviation present.  Cardiovascular: Normal rate, regular rhythm, normal heart sounds and intact distal pulses.  Pulmonary/Chest: Effort normal and breath sounds normal. No stridor. He has no wheezes. He has no rales.  Abdominal: Soft. Bowel sounds are normal. He exhibits no mass. There is no tenderness. There is no rebound and no guarding.  Genitourinary: Rectal exam shows guaiac positive stool.  Musculoskeletal: Normal range of motion. He exhibits no edema.  Neurological: He is alert and oriented to person, place, and time. He displays normal reflexes. He exhibits normal muscle tone. Coordination normal.  Skin: Skin is warm and dry. Capillary refill takes  less than 2 seconds. He is not diaphoretic. There is pallor.     ED Treatments / Results  Labs (all labs ordered are listed, but only abnormal results are displayed)  Results for orders placed or performed during the hospital encounter of 05/08/17  Urinalysis, Routine w reflex microscopic- may I&O cath if menses  Result Value Ref Range   Color, Urine AMBER (A) YELLOW   APPearance TURBID (A) CLEAR   Specific Gravity, Urine 1.014 1.005 - 1.030   pH 5.0 5.0 - 8.0   Glucose, UA 50 (A) NEGATIVE mg/dL   Hgb urine dipstick LARGE (A) NEGATIVE   Bilirubin Urine NEGATIVE NEGATIVE   Ketones, ur NEGATIVE NEGATIVE mg/dL   Protein, ur 161 (A) NEGATIVE mg/dL   Nitrite POSITIVE (A) NEGATIVE   Leukocytes, UA LARGE (A) NEGATIVE   RBC / HPF TOO NUMEROUS TO COUNT 0 - 5 RBC/hpf   WBC, UA TOO NUMEROUS TO COUNT 0 - 5 WBC/hpf   Bacteria, UA MANY (A) NONE SEEN   Squamous Epithelial / LPF NONE SEEN NONE SEEN   WBC Clumps PRESENT    Mucus PRESENT    Non Squamous Epithelial 0-5 (A) NONE SEEN  CBC  with Differential/Platelet  Result Value Ref Range   WBC 9.0 4.0 - 10.5 K/uL   RBC 2.46 (L) 4.22 - 5.81 MIL/uL   Hemoglobin 7.7 (L) 13.0 - 17.0 g/dL   HCT 09.6 (L) 04.5 - 40.9 %   MCV 94.3 78.0 - 100.0 fL   MCH 31.3 26.0 - 34.0 pg   MCHC 33.2 30.0 - 36.0 g/dL   RDW 81.1 (H) 91.4 - 78.2 %   Platelets 267 150 - 400 K/uL   Neutrophils Relative % 66 %   Neutro Abs 6.0 1.7 - 7.7 K/uL   Lymphocytes Relative 19 %   Lymphs Abs 1.7 0.7 - 4.0 K/uL   Monocytes Relative 14 %   Monocytes Absolute 1.2 (H) 0.1 - 1.0 K/uL   Eosinophils Relative 1 %   Eosinophils Absolute 0.1 0.0 - 0.7 K/uL   Basophils Relative 0 %   Basophils Absolute 0.0 0.0 - 0.1 K/uL  I-Stat Chem 8, ED  Result Value Ref Range   Sodium 132 (L) 135 - 145 mmol/L   Potassium 3.9 3.5 - 5.1 mmol/L   Chloride 97 (L) 101 - 111 mmol/L   BUN 57 (H) 6 - 20 mg/dL   Creatinine, Ser 9.56 (H) 0.61 - 1.24 mg/dL   Glucose, Bld 213 (H) 65 - 99 mg/dL   Calcium, Ion 0.86 (L) 1.15 - 1.40 mmol/L   TCO2 22 22 - 32 mmol/L   Hemoglobin 8.2 (L) 13.0 - 17.0 g/dL   HCT 57.8 (L) 46.9 - 62.9 %  POC occult blood, ED Provider will collect  Result Value Ref Range   Fecal Occult Bld POSITIVE (A) NEGATIVE  Type and screen  Result Value Ref Range   ABO/RH(D) O NEG    Antibody Screen NEG    Sample Expiration 05/12/2017    Ct Renal Stone Study  Result Date: 05/09/2017 CLINICAL DATA:  Acute onset of right back pain. Gross hematuria. Large amount of white blood cells and white blood cells in the urine. EXAM: CT ABDOMEN AND PELVIS WITHOUT CONTRAST TECHNIQUE: Multidetector CT imaging of the abdomen and pelvis was performed following the standard protocol without IV contrast. COMPARISON:  CT of the chest, abdomen and pelvis performed 02/21/2017 FINDINGS: Lower chest: Mild bibasilar scarring is noted. Scattered coronary artery calcifications are seen. Hepatobiliary:  The liver is unremarkable in appearance. The gallbladder is unremarkable in appearance. The  common bile duct remains normal in caliber. Pancreas: The pancreas is within normal limits. Spleen: The spleen is unremarkable in appearance. Adrenals/Urinary Tract: The adrenal glands are unremarkable in appearance. Minimal left-sided hydronephrosis is noted, without evidence of distal obstruction. Nonspecific perinephric stranding is noted bilaterally. No renal or ureteral stones are identified. Stomach/Bowel: The stomach is unremarkable in appearance. The small bowel is within normal limits. The appendix is not visualized; there is no evidence for appendicitis. The colon is unremarkable in appearance. Vascular/Lymphatic: Diffuse calcification is seen along the abdominal aorta and its branches. The abdominal aorta is otherwise grossly unremarkable. The inferior vena cava is grossly unremarkable. No retroperitoneal lymphadenopathy is seen. No pelvic sidewall lymphadenopathy is identified. Reproductive: Marked wall thickening is noted along the bladder, more prominent anteriorly. This may reflect severe cystitis. Underlying mass cannot be excluded. The prostate remains normal in size. A Foley catheter is noted in expected position. Other: No additional soft tissue abnormalities are seen. Musculoskeletal: No acute osseous abnormalities are identified. Lumbar spinal fusion is noted at T12-L2. Multilevel vacuum phenomenon is noted along the lower thoracic and lumbar spine. The visualized musculature is unremarkable in appearance. IMPRESSION: 1. Marked bladder wall thickening, more prominent anteriorly. This may reflect severe cystitis. Underlying mass cannot be excluded. Would correlate with the patient's lab findings, and consider cystoscopy if deemed clinically appropriate. 2. Minimal left-sided hydronephrosis, without evidence of distal obstruction. 3. Scattered coronary artery calcifications seen. 4. Degenerative change along the lower thoracic and lumbar spine. Aortic Atherosclerosis (ICD10-I70.0). Electronically  Signed   By: Roanna Raider M.D.   On: 05/09/2017 02:21    Radiology Ct Renal Stone Study  Result Date: 05/09/2017 CLINICAL DATA:  Acute onset of right back pain. Gross hematuria. Large amount of white blood cells and white blood cells in the urine. EXAM: CT ABDOMEN AND PELVIS WITHOUT CONTRAST TECHNIQUE: Multidetector CT imaging of the abdomen and pelvis was performed following the standard protocol without IV contrast. COMPARISON:  CT of the chest, abdomen and pelvis performed 02/21/2017 FINDINGS: Lower chest: Mild bibasilar scarring is noted. Scattered coronary artery calcifications are seen. Hepatobiliary: The liver is unremarkable in appearance. The gallbladder is unremarkable in appearance. The common bile duct remains normal in caliber. Pancreas: The pancreas is within normal limits. Spleen: The spleen is unremarkable in appearance. Adrenals/Urinary Tract: The adrenal glands are unremarkable in appearance. Minimal left-sided hydronephrosis is noted, without evidence of distal obstruction. Nonspecific perinephric stranding is noted bilaterally. No renal or ureteral stones are identified. Stomach/Bowel: The stomach is unremarkable in appearance. The small bowel is within normal limits. The appendix is not visualized; there is no evidence for appendicitis. The colon is unremarkable in appearance. Vascular/Lymphatic: Diffuse calcification is seen along the abdominal aorta and its branches. The abdominal aorta is otherwise grossly unremarkable. The inferior vena cava is grossly unremarkable. No retroperitoneal lymphadenopathy is seen. No pelvic sidewall lymphadenopathy is identified. Reproductive: Marked wall thickening is noted along the bladder, more prominent anteriorly. This may reflect severe cystitis. Underlying mass cannot be excluded. The prostate remains normal in size. A Foley catheter is noted in expected position. Other: No additional soft tissue abnormalities are seen. Musculoskeletal: No acute  osseous abnormalities are identified. Lumbar spinal fusion is noted at T12-L2. Multilevel vacuum phenomenon is noted along the lower thoracic and lumbar spine. The visualized musculature is unremarkable in appearance. IMPRESSION: 1. Marked bladder wall thickening, more prominent anteriorly. This may reflect severe  cystitis. Underlying mass cannot be excluded. Would correlate with the patient's lab findings, and consider cystoscopy if deemed clinically appropriate. 2. Minimal left-sided hydronephrosis, without evidence of distal obstruction. 3. Scattered coronary artery calcifications seen. 4. Degenerative change along the lower thoracic and lumbar spine. Aortic Atherosclerosis (ICD10-I70.0). Electronically Signed   By: Roanna RaiderJeffery  Chang M.D.   On: 05/09/2017 02:21    Procedures Procedures (including critical care time)  Medications Ordered in ED Medications  sodium chloride 0.9 % bolus 1,000 mL (not administered)  oxybutynin (DITROPAN-XL) 24 hr tablet 10 mg (not administered)  acetaminophen (TYLENOL) tablet 1,000 mg (not administered)  cefTRIAXone (ROCEPHIN) 1 g in dextrose 5 % 50 mL IVPB (0 g Intravenous Stopped 05/09/17 0152)      Final Clinical Impressions(s) / ED Diagnoses   Final diagnoses:  Lower urinary tract infectious disease  Acute renal failure with tubular necrosis (HCC)  Iron deficiency anemia due to chronic blood loss  Failure to thrive in adult       Genavieve Mangiapane, MD 05/09/17 0240

## 2017-05-09 NOTE — Progress Notes (Signed)
07/09/16  1340  Notified MD per tele monitor patient had a 1.2 sec pause.

## 2017-05-09 NOTE — ED Notes (Signed)
Notified EDP,Palumbo,MD. Pt. I-stat Chem 8 results hemoglobin 8.2.

## 2017-05-09 NOTE — Consult Note (Signed)
Urology Consult  CC: Referring physician: Dr. Albertine Grates Reason for referral: Gross hematuria and urinary retention.  Impression/Assessment: 1.  Gross hematuria: When he was seen recently in the office he was not experiencing any hematuria and the catheter was removed.  Due to recent urinary tract manipulation UTI is the most likely cause of his gross hematuria especially with nitrite positive urine and many bacteria noted on urinalysis.  He appears to have some thickening of the bladder wall as well on CT scan.  Despite having gross hematuria a 16 French catheter was inserted.  Fortunately he has not developed clots and occluded the catheter however if this occurs a larger bore catheter would be indicated and I would favor a three-way Foley catheter for continuous bladder irrigation if that is necessary otherwise since his 15 French Foley catheter is draining adequately now with no clots seen in the tubing I would leave this in place.  2.  Acute urinary retention: He has a history of urinary retention in the past.  He has known BPH.  He has been taking an alpha-blocker.  A repeat voiding trial will be undertaken once infection has been treated if present.  This will be performed as an outpatient.  3.  Mild left hydronephrosis by CT scan: He did have bilateral hydronephrosis when he was in urinary retention previously.  That has now resolved and he has very minimal dilation of the renal pelvis on the left-hand side with minimal dilation of the ureter on the left and no dilation on the right.   I suspect this is possibly some residual dilatation of the left ureter although it could be secondary to his urinary retention.  This can be followed up as an outpatient with renal ultrasound as well.  4.  Acute renal insufficiency: His creatinine has increased from 1.21 on 02/21/17 to 3.0 now.  This may be secondary to his acute urinary retention but there is also a good probability that this is multifactorial  and could be due to dehydration and other renal factors.  This can be followed now that the Foley catheter has been inserted and he is being rehydrated but I do not feel the mild left hydronephrosis noted on the CT scan would account for this elevation and therefore would not recommend any form of surgical intervention for decompression on the left-hand side.   Plan: 1.  Urine culture pending with patient receiving Rocephin currently. 2.  Leave 16 French Foley catheter indwelling with irrigation as needed to maintain drainage. 3.  If he is having difficulty with continued occlusion of his current catheter it should be replaced with a 20 [place] three-way Foley catheter and begin continuous bladder irrigation. 4.  Maintain Foley catheter indwelling upon discharge. 5.  Hydration and monitoring of creatinine. 6.  Hold Coumadin. 7.  No need for surgical intervention at this time.    History of Present Illness: Justin Salazar is an 83 year old male patient of Dr. Alvester Morin seen in hospital consultation today for gross hematuria and urinary retention. The patient recently had a fall back on August 31. He has since required a Foley catheter after a CT scan revealed a severely distended bladder with bilateral hydronephrosis. Prior to this, he was experiencing frequency, urgency, nocturia 4-5 times a night, weak stream, intermittency, and hesitancy. The patient has never tried any medication for his prostate. He recently underwent a urodynamic study which revealed that he generated a good contraction upon his attempt to void. He however  was unable to void. The findings were consistent with bladder outlet obstruction.  He was not interested in any surgery at the moment. He is on warfarin for what he states is for stroke.  Renal ultrasound appeared to not have any hydronephrosis bilaterally. That being said, the kidneys were relatively poorly visualized. Bilateral hydronephrosis, likely resolved almost certainly  secondary to severe bladder distention and outlet obstruction. Pt deferred cystoscopy at last OV.  He underwent a voiding trial on 04/08/17 which was successful and his Foley catheter was left out. He came into the hospital yesterday having developed gross hematuria and inability to urinate.  A Foley catheter was inserted and his urine was noted to be grossly bloody.  He reports that prior to coming in he was having some pain in the lower back primarily on the right-hand side that had increased over the past few days.  He also reported some slight discomfort in the penis suggesting possible dysuria.  No fever.  Past Medical History:  Diagnosis Date  . Coronary artery disease   . Diabetes mellitus without complication (HCC)   . GERD (gastroesophageal reflux disease)   . Hypertension   . Thyroid disease    Past Surgical History:  Procedure Laterality Date  . NASAL SINUS SURGERY    . ORIF NASAL FRACTURE N/A 03/13/2017   Procedure: OPEN REDUCTION INTERNAL FIXATION (ORIF) NASAL FRACTURE WITH INTERAL AND EXTERNAL SPLINTING;  Surgeon: Peggye Formillingham, Claire S, DO;  Location: [facility];  Service: Plastics;  Laterality: N/A;  . SHOULDER SURGERY    . TONSILLECTOMY      Medications:  Scheduled: . atorvastatin  20 mg Oral q1800  . doxepin  10 mg Oral QHS  . feeding supplement  1 Container Oral TID BM  . ferrous sulfate  325 mg Oral Q breakfast  . gabapentin  100 mg Oral QHS  . insulin aspart  0-9 Units Subcutaneous TID WC  . levothyroxine  175 mcg Oral QAC breakfast  . lipase/protease/amylase  36,000 Units Oral TID AC  . metoprolol succinate  25 mg Oral Daily  . oxybutynin  10 mg Oral QHS  . pantoprazole (PROTONIX) IV  40 mg Intravenous Q24H  . sertraline  100 mg Oral Daily  . sulfaSALAzine  1,000 mg Oral BID   Continuous: . sodium chloride 75 mL/hr at 05/09/17 0417  . cefTRIAXone (ROCEPHIN)  IV      Allergies: No Known Allergies  Family History  Problem Relation Age of  Onset  . Hypertension Other     Social History:  reports that  has never smoked. he has never used smokeless tobacco. He reports that he drinks alcohol. He reports that he does not use drugs.  Review of Systems (10 point): Pertinent items are noted in HPI. A comprehensive review of systems was negative except as noted above.  Physical Exam:  Vital signs in last 24 hours: Temp:  [98.3 F (36.8 C)-98.4 F (36.9 C)] 98.4 F (36.9 C) (11/16 0513) Pulse Rate:  [82-87] 86 (11/16 0513) Resp:  [16-18] 16 (11/16 0513) BP: (118-151)/(59-82) 148/74 (11/16 0513) SpO2:  [94 %-99 %] 98 % (11/16 0513) Weight:  [74.8 kg (165 lb)-74.9 kg (165 lb 2 oz)] 74.9 kg (165 lb 2 oz) (11/16 0509) General appearance: alert and appears stated age Head: Normocephalic, without obvious abnormality, atraumatic Eyes: conjunctivae/corneas clear. EOM's intact.  Oropharynx: moist mucous membranes Neck: supple, symmetrical, trachea midline Resp: normal respiratory effort Cardio: regular rate and rhythm Back: symmetric, no curvature. ROM  normal. No CVA tenderness. GI: soft, non-tender; bowel sounds normal; no masses,  no organomegaly  Male genitalia: penis: normal male phallus with no lesions or discharge.Foley catheter indwelling draining blood-tinged urine with no clots. testes: bilaterally descended with no masses or tenderness. no hernias  Extremities: extremities normal, atraumatic, no cyanosis or edema Skin: Skin color normal. No visible rashes or lesions Neurologic: Grossly normal  Laboratory Data:  Recent Labs    05/09/17 0057 05/09/17 0059 05/09/17 0440 05/09/17 0716  WBC 9.0  --  9.2 7.7  HGB 7.7* 8.2* 7.8* 7.8*  HCT 23.2* 24.0* 23.9* 23.2*   BMET Recent Labs    05/09/17 0059 05/09/17 0716  NA 132* 132*  K 3.9 3.8  CL 97* 99*  CO2  --  24  GLUCOSE 204* 195*  BUN 57* 58*  CREATININE 3.00* 2.35*  CALCIUM  --  8.2*   Recent Labs    05/09/17 0208  INR 2.77   No results for input(s):  LABURIN in the last 72 hours. No results found for this or any previous visit. Creatinine: Recent Labs    05/09/17 0059 05/09/17 0716  CREATININE 3.00* 2.35*    Imaging: Ct Renal Stone Study  Result Date: 05/09/2017 CLINICAL DATA:  Acute onset of right back pain. Gross hematuria. Large amount of white blood cells and white blood cells in the urine. EXAM: CT ABDOMEN AND PELVIS WITHOUT CONTRAST TECHNIQUE: Multidetector CT imaging of the abdomen and pelvis was performed following the standard protocol without IV contrast. COMPARISON:  CT of the chest, abdomen and pelvis performed 02/21/2017 FINDINGS: Lower chest: Mild bibasilar scarring is noted. Scattered coronary artery calcifications are seen. Hepatobiliary: The liver is unremarkable in appearance. The gallbladder is unremarkable in appearance. The common bile duct remains normal in caliber. Pancreas: The pancreas is within normal limits. Spleen: The spleen is unremarkable in appearance. Adrenals/Urinary Tract: The adrenal glands are unremarkable in appearance. Minimal left-sided hydronephrosis is noted, without evidence of distal obstruction. Nonspecific perinephric stranding is noted bilaterally. No renal or ureteral stones are identified. Stomach/Bowel: The stomach is unremarkable in appearance. The small bowel is within normal limits. The appendix is not visualized; there is no evidence for appendicitis. The colon is unremarkable in appearance. Vascular/Lymphatic: Diffuse calcification is seen along the abdominal aorta and its branches. The abdominal aorta is otherwise grossly unremarkable. The inferior vena cava is grossly unremarkable. No retroperitoneal lymphadenopathy is seen. No pelvic sidewall lymphadenopathy is identified. Reproductive: Marked wall thickening is noted along the bladder, more prominent anteriorly. This may reflect severe cystitis. Underlying mass cannot be excluded. The prostate remains normal in size. A Foley catheter is  noted in expected position. Other: No additional soft tissue abnormalities are seen. Musculoskeletal: No acute osseous abnormalities are identified. Lumbar spinal fusion is noted at T12-L2. Multilevel vacuum phenomenon is noted along the lower thoracic and lumbar spine. The visualized musculature is unremarkable in appearance. IMPRESSION: 1. Marked bladder wall thickening, more prominent anteriorly. This may reflect severe cystitis. Underlying mass cannot be excluded. Would correlate with the patient's lab findings, and consider cystoscopy if deemed clinically appropriate. 2. Minimal left-sided hydronephrosis, without evidence of distal obstruction. 3. Scattered coronary artery calcifications seen. 4. Degenerative change along the lower thoracic and lumbar spine. Aortic Atherosclerosis (ICD10-I70.0). Electronically Signed   By: Roanna RaiderJeffery  Chang M.D.   On: 05/09/2017 02:21   CT scan images were independently reviewed as noted above.    Sherrice Creekmore C 05/09/2017, 9:32 AM

## 2017-05-09 NOTE — Progress Notes (Addendum)
PROGRESS NOTE  Justin Salazar ZOX:[id number] DOB: 10/25/1926 DOA: 05/08/2017 PCP: Vivien Prestoorrington, Kip A, MD  HPI/Recap of past 24 hours:  Gross hematuria in foley, report back pain has resolved, no fever, no abdominal pain Report h/o ulcerative colitis has not had flares up for a long time, last colonoscopy a few years ago  Assessment/Plan: Principal Problem:   Acute lower UTI Active Problems:   Acute urinary retention   Chronic anticoagulation   Acute renal failure (ARF) (HCC)   Acute blood loss anemia   Diabetes mellitus type 2, controlled (HCC)   Acute GI bleeding   Hypothyroidism   Essential hypertension   Acute UTI   Gross hematuria with urinary retention, acute renal failure -Ct renal stone on admission "1. Marked bladder wall thickening, more prominent anteriorly. This may reflect severe cystitis. Underlying mass cannot be excluded. Would correlate with the patient's lab findings, and consider cystoscopy if deemed clinically appropriate. 2. Minimal left-sided hydronephrosis, without evidence of distal obstruction." -foley placed in the ED -urine culture pending, renal dosing meds, hold metformine, hold lisinopril/hctz, avoid NSAIDS -he is started on rocephin in the Ed, continue rocephin for now, no fever, no leukocytosis, follow up on urine culture -urology consult, keep n[po for now   Blood loss anemia:  -Likely from gross hematuria -hgb 12.2 on 03/13/2017, hgb 7.7 on admission -stool occult blood +, patient is on chronic iron supplement, patient denies seeing blood in the stool, reports h/o patient has history of ulcerative colitis on sulfasalazine. But denies abdominal pain, no diarrhea, reports no flares in the last few years. CT without contrast "The colon is unremarkable in appearance." -patient is pale, but denies acute sob, no chest pain, no dizziness. Blood pressure stable, no tachycardia. Monitor hgb, may need to transfuse, may need to reverse coumadin -Hold  asa /coumadinl   PAF, on coumadin for the last few years, INR 2.27, coumadin held in the setting of gross hematuria, may need to reverse if gross hematuria continues Sinus rhythm, continue toprol xl, on tele  2pm, sinus pause 1.2 s on tele, patient is asymptomatic, will check bmp/mag, get ekg, keep on tele. Pm labs showed improving cr , does has hypokalemia, hypomagnesemia: replace k/mag    HTN; continue toprol, hold norvasc/lisinopril/hctz.  Noninsulin dependent dm2,  a1c pending,  Hold home oral meds,  Blood glucose elevated, will start lantus, meal coverage in addition to ssi  Back pain: report right sided Pain has resolved this am CT "Degenerative change along the lower thoracic and lumbar spine.  FTT: falls at home. No neck collar Will need PT evel once hematuria stablized.   Code Status: full  Family Communication: patient   Disposition Plan: remain in the hospital   Consultants:  Urology Dr Vernie Ammonsttelin   Procedures:  none  Antibiotics:  rocephin   Objective: BP (!) 148/74 (BP Location: Right Arm)   Pulse 86   Temp 98.4 F (36.9 C) (Oral)   Resp 16   Ht 5\' 9"  (1.753 m)   Wt 74.9 kg (165 lb 2 oz)   SpO2 98%   BMI 24.38 kg/m   Intake/Output Summary (Last 24 hours) at 05/09/2017 0857 Last data filed at 05/09/2017 [id number] Gross per 24 hour  Intake 1050 ml  Output 350 ml  Net 700 ml   Filed Weights   05/08/17 2108 05/09/17 0509  Weight: 74.8 kg (165 lb) 74.9 kg (165 lb 2 oz)    Exam: Patient is examined daily including today on 05/09/2017, exams  remain the same as of yesterday except that has changed    General:  NAD, pale, gross hematuria in foley  Cardiovascular: RRR  Respiratory: CTABL  Abdomen: Soft/ND/NT, positive BS  Musculoskeletal: No Edema  Neuro: alert, oriented   Data Reviewed: Basic Metabolic Panel: Recent Labs  Lab 05/09/17 0059 05/09/17 0716  NA 132* 132*  K 3.9 3.8  CL 97* 99*  CO2  --  24  GLUCOSE 204* 195*  BUN  57* 58*  CREATININE 3.00* 2.35*  CALCIUM  --  8.2*   Liver Function Tests: No results for input(s): AST, ALT, ALKPHOS, BILITOT, PROT, ALBUMIN in the last 168 hours. No results for input(s): LIPASE, AMYLASE in the last 168 hours. No results for input(s): AMMONIA in the last 168 hours. CBC: Recent Labs  Lab 05/09/17 0057 05/09/17 0059 05/09/17 0440 05/09/17 0716  WBC 9.0  --  9.2 7.7  NEUTROABS 6.0  --   --   --   HGB 7.7* 8.2* 7.8* 7.8*  HCT 23.2* 24.0* 23.9* 23.2*  MCV 94.3  --  94.5 93.9  PLT 267  --  263 239   Cardiac Enzymes:   No results for input(s): CKTOTAL, CKMB, CKMBINDEX, TROPONINI in the last 168 hours. BNP (last 3 results) No results for input(s): BNP in the last 8760 hours.  ProBNP (last 3 results) No results for input(s): PROBNP in the last 8760 hours.  CBG: Recent Labs  Lab 05/09/17 0733  GLUCAP 188*    No results found for this or any previous visit (from the past 240 hour(s)).   Studies: Ct Renal Stone Study  Result Date: 05/09/2017 CLINICAL DATA:  Acute onset of right back pain. Gross hematuria. Large amount of white blood cells and white blood cells in the urine. EXAM: CT ABDOMEN AND PELVIS WITHOUT CONTRAST TECHNIQUE: Multidetector CT imaging of the abdomen and pelvis was performed following the standard protocol without IV contrast. COMPARISON:  CT of the chest, abdomen and pelvis performed 02/21/2017 FINDINGS: Lower chest: Mild bibasilar scarring is noted. Scattered coronary artery calcifications are seen. Hepatobiliary: The liver is unremarkable in appearance. The gallbladder is unremarkable in appearance. The common bile duct remains normal in caliber. Pancreas: The pancreas is within normal limits. Spleen: The spleen is unremarkable in appearance. Adrenals/Urinary Tract: The adrenal glands are unremarkable in appearance. Minimal left-sided hydronephrosis is noted, without evidence of distal obstruction. Nonspecific perinephric stranding is noted  bilaterally. No renal or ureteral stones are identified. Stomach/Bowel: The stomach is unremarkable in appearance. The small bowel is within normal limits. The appendix is not visualized; there is no evidence for appendicitis. The colon is unremarkable in appearance. Vascular/Lymphatic: Diffuse calcification is seen along the abdominal aorta and its branches. The abdominal aorta is otherwise grossly unremarkable. The inferior vena cava is grossly unremarkable. No retroperitoneal lymphadenopathy is seen. No pelvic sidewall lymphadenopathy is identified. Reproductive: Marked wall thickening is noted along the bladder, more prominent anteriorly. This may reflect severe cystitis. Underlying mass cannot be excluded. The prostate remains normal in size. A Foley catheter is noted in expected position. Other: No additional soft tissue abnormalities are seen. Musculoskeletal: No acute osseous abnormalities are identified. Lumbar spinal fusion is noted at T12-L2. Multilevel vacuum phenomenon is noted along the lower thoracic and lumbar spine. The visualized musculature is unremarkable in appearance. IMPRESSION: 1. Marked bladder wall thickening, more prominent anteriorly. This may reflect severe cystitis. Underlying mass cannot be excluded. Would correlate with the patient's lab findings, and consider cystoscopy if deemed clinically  appropriate. 2. Minimal left-sided hydronephrosis, without evidence of distal obstruction. 3. Scattered coronary artery calcifications seen. 4. Degenerative change along the lower thoracic and lumbar spine. Aortic Atherosclerosis (ICD10-I70.0). Electronically Signed   By: Roanna Raider M.D.   On: 05/09/2017 02:21    Scheduled Meds: . amLODipine  10 mg Oral Daily  . atorvastatin  20 mg Oral q1800  . doxepin  10 mg Oral QHS  . feeding supplement  1 Container Oral TID BM  . ferrous sulfate  325 mg Oral Q breakfast  . gabapentin  100 mg Oral QHS  . insulin aspart  0-9 Units Subcutaneous TID  WC  . levothyroxine  175 mcg Oral QAC breakfast  . lipase/protease/amylase  36,000 Units Oral TID AC  . metoprolol succinate  25 mg Oral Daily  . oxybutynin  10 mg Oral QHS  . pantoprazole (PROTONIX) IV  40 mg Intravenous Q24H  . sertraline  100 mg Oral Daily  . sulfaSALAzine  1,000 mg Oral BID    Continuous Infusions: . sodium chloride 75 mL/hr at 05/09/17 0417  . cefTRIAXone (ROCEPHIN)  IV       Time spent: 35 mins from 8:30 to 9:15 am, case discussed with urology I have personally reviewed and interpreted on  05/09/2017 daily labs, tele strips, imagings as discussed above under date review session and assessment and plans.  I reviewed all nursing notes, pharmacy notes, vitals, pertinent old records  I have discussed plan of care as described above with RN , patient  on 05/09/2017   Albertine Grates MD, PhD  [facility] Pager [phone]. If 7PM-7AM, please contact night-coverage at [email or web address], password [facility] 05/09/2017, 8:57 AM  LOS: 0 days

## 2017-05-09 NOTE — ED Notes (Signed)
Bed assigned 1405

## 2017-05-09 NOTE — Plan of Care (Signed)
  Nutrition: Adequate nutrition will be maintained 05/09/2017 1427 - Progressing by William Daltonarpenter, Leniya Breit L, RN   Pain Managment: General experience of comfort will improve 05/09/2017 1427 - Progressing by William Daltonarpenter, Faelynn Wynder L, RN

## 2017-05-09 NOTE — H&P (Signed)
History and Physical    Justin Salazar:[id number] DOB: 06-Apr-1927 DOA: 05/08/2017  PCP: Vivien Presto, MD  Patient coming from: Home.  Chief Complaint: Hematuria.  HPI: Justin Salazar is a 83 y.o. male with history of atrial fibrillation, ulcerative colitis, hypertension, diabetes, hypothyroidism who has had a recent fall and had cervical fracture on cervical collar, also had a recent nasal fracture surgery and patient is on Coumadin presents to the ER because of worsening hematuria for last 2 days.  Patient also has been examined low back pain.  Denies any fever chills nausea vomiting or diarrhea.  Denies any chest pain or shortness of breath.  ED Course: In the ER patient had frank hematuria and also had some urine retention for which patient was placed on Foley catheter.  Stool for occult blood was positive.  Labs reveal worsening creatinine.  Patient's creatinine 2 months ago was 0.9 now it is 3 and hemoglobin has dropped from 12 g to 7.7.  Patient states since his cervical fracture he has been taking ibuprofen for pain relief.  Patient is also on ARB and hydrochlorothiazide.  Patient was started on ceftriaxone for possible UTI.  CT of the abdomen done per renal protocol was showing cystitis underlying mass not excluded with some mild left-sided hydronephrosis.  Review of Systems: As per HPI, rest all negative.   Past Medical History:  Diagnosis Date  . Coronary artery disease   . Diabetes mellitus without complication (HCC)   . GERD (gastroesophageal reflux disease)   . Hypertension   . Thyroid disease     Past Surgical History:  Procedure Laterality Date  . NASAL SINUS SURGERY    . ORIF NASAL FRACTURE N/A 03/13/2017   Procedure: OPEN REDUCTION INTERNAL FIXATION (ORIF) NASAL FRACTURE WITH INTERAL AND EXTERNAL SPLINTING;  Surgeon: Peggye Form, DO;  Location: [facility];  Service: Plastics;  Laterality: N/A;  . SHOULDER SURGERY    .  TONSILLECTOMY       reports that  has never smoked. he has never used smokeless tobacco. He reports that he drinks alcohol. He reports that he does not use drugs.  No Known Allergies  Family History  Problem Relation Age of Onset  . Hypertension Other     Prior to Admission medications   Medication Sig Start Date End Date Taking? Authorizing Provider  amLODipine (NORVASC) 10 MG tablet Take 10 mg by mouth daily.   Yes [provider]  aspirin 81 MG tablet Take 81 mg by mouth daily.   Yes [provider]  atorvastatin (LIPITOR) 20 MG tablet Take 20 mg by mouth daily. 02/14/17  Yes [provider]  b complex vitamins tablet Take 1 tablet by mouth daily.   Yes [provider]  CALCIUM-MAGNESIUM-ZINC PO Take 1 tablet daily by mouth.   Yes [provider]  Cholecalciferol (VITAMIN D3) 1000 units CAPS Take 2,000 Units by mouth daily.    Yes [provider]  doxepin (SINEQUAN) 10 MG capsule Take 10 mg by mouth at bedtime.    Yes [provider]  ferrous sulfate 325 (65 FE) MG EC tablet Take 325 mg daily with breakfast by mouth.   Yes [provider]  gabapentin (NEURONTIN) 100 MG capsule Take 100 mg at bedtime by mouth.  01/28/17  Yes [provider]  ibuprofen (ADVIL,MOTRIN) 200 MG tablet Take 400 mg every 4 (four) hours as needed by mouth for moderate pain.    Yes [provider]  Ibuprofen-Diphenhydramine HCl (IBUPROFEN PM) 200-25 MG CAPS Take 2 tablets at bedtime by mouth.   Yes [provider]  levothyroxine (SYNTHROID, LEVOTHROID) 175 MCG tablet Take 175 mcg by mouth daily before breakfast.   Yes [provider]  lipase/protease/amylase (CREON) 12000 UNITS CPEP capsule Take 36,000 Units by mouth 3 (three) times daily before meals.    Yes [provider]  losartan-hydrochlorothiazide (HYZAAR) 100-25 MG tablet Take 1 tablet every morning by mouth.    Yes [provider]    Melatonin 10 MG TABS Take 10 mg by mouth at bedtime.   Yes [provider]  Menthol-Methyl Salicylate (MUSCLE RUB EX) Apply 1 application at bedtime topically. Apply to legs   Yes [provider]  metFORMIN (GLUCOPHAGE) 500 MG tablet Take 1,000 mg by mouth 2 (two) times daily with a meal.    Yes [provider]  metoprolol succinate (TOPROL-XL) 25 MG 24 hr tablet Take 25 mg by mouth daily. 11/26/16  Yes [provider]  ranitidine (ZANTAC) 150 MG tablet Take 150 mg by mouth at bedtime.    Yes [provider]  sertraline (ZOLOFT) 100 MG tablet Take 100 mg every morning by mouth.    Yes [provider]  sitaGLIPtin (JANUVIA) 100 MG tablet Take 100 mg at bedtime by mouth.  02/03/17 02/03/18 Yes [provider]  sulfaSALAzine (AZULFIDINE) 500 MG tablet Take 1,000 mg by mouth 2 (two) times daily.    Yes [provider]  vitamin B-12 (CYANOCOBALAMIN) 1000 MCG tablet Take 1,000 mcg daily by mouth.   Yes [provider]  warfarin (COUMADIN) 1 MG tablet Take 1 mg every Monday, Wednesday, and Friday by mouth. Pt takes with the 5mg  to get a total of 6mg    Yes [provider]  warfarin (COUMADIN) 5 MG tablet Take 5-10 mg by mouth See admin instructions. Pt takes 5mg  Tue, Thurs, Sat, Sun - takes 10mg  M, W, F   Yes [provider]  HYDROcodone-acetaminophen (NORCO/VICODIN) 5-325 MG tablet Take 1 tablet by mouth every 4 (four) hours as needed. Patient not taking: Reported on 05/09/2017 02/21/17   Tegeler, Canary Brim, MD    Physical Exam: Vitals:   05/08/17 2108 05/08/17 2109 05/09/17 0002 05/09/17 0229  BP:  118/73 124/66 (!) 151/82  Pulse:  87 82 87  Resp:  18 16 16   Temp:  98.3 F (36.8 C)    TempSrc:  Oral    SpO2:  94% 98% 99%  Weight: 74.8 kg (165 lb)     Height: 5\' 9"  (1.753 m)         Constitutional: Moderately built and nourished. Vitals:   05/08/17 2108 05/08/17 2109 05/09/17 0002 05/09/17  0229  BP:  118/73 124/66 (!) 151/82  Pulse:  87 82 87  Resp:  18 16 16   Temp:  98.3 F (36.8 C)    TempSrc:  Oral    SpO2:  94% 98% 99%  Weight: 74.8 kg (165 lb)     Height: 5\' 9"  (1.753 m)      Eyes: Anicteric no pallor. ENMT: No discharge from the ears eyes nose or mouth. Neck: No mass felt.  No neck rigidity. Respiratory: No rhonchi or crepitations. Cardiovascular: S1-S2 heard no murmurs appreciated. Abdomen: Soft nontender bowel sounds present. Musculoskeletal: No edema.  No joint effusion. Skin: No rash.  Skin appears warm. Neurologic: Alert awake oriented to time place and person.  Moves all extremities. Psychiatric: Appears normal.  Normal  affect.   Labs on Admission: I have personally reviewed following labs and imaging studies  CBC: Recent Labs  Lab 05/09/17 0057 05/09/17 0059  WBC 9.0  --   NEUTROABS 6.0  --   HGB 7.7* 8.2*  HCT 23.2* 24.0*  MCV 94.3  --   PLT 267  --    Basic Metabolic Panel: Recent Labs  Lab 05/09/17 0059  NA 132*  K 3.9  CL 97*  GLUCOSE 204*  BUN 57*  CREATININE 3.00*   GFR: Estimated Creatinine Clearance: 17 mL/min (A) (by C-G formula based on SCr of 3 mg/dL (H)). Liver Function Tests: No results for input(s): AST, ALT, ALKPHOS, BILITOT, PROT, ALBUMIN in the last 168 hours. No results for input(s): LIPASE, AMYLASE in the last 168 hours. No results for input(s): AMMONIA in the last 168 hours. Coagulation Profile: Recent Labs  Lab 05/09/17 0208  INR 2.77   Cardiac Enzymes: No results for input(s): CKTOTAL, CKMB, CKMBINDEX, TROPONINI in the last 168 hours. BNP (last 3 results) No results for input(s): PROBNP in the last 8760 hours. HbA1C: No results for input(s): HGBA1C in the last 72 hours. CBG: No results for input(s): GLUCAP in the last 168 hours. Lipid Profile: No results for input(s): CHOL, HDL, LDLCALC, TRIG, CHOLHDL, LDLDIRECT in the last 72 hours. Thyroid Function Tests: No results for input(s): TSH, T4TOTAL,  FREET4, T3FREE, THYROIDAB in the last 72 hours. Anemia Panel: No results for input(s): VITAMINB12, FOLATE, FERRITIN, TIBC, IRON, RETICCTPCT in the last 72 hours. Urine analysis:    Component Value Date/Time   COLORURINE AMBER (A) 05/08/2017 2110   APPEARANCEUR TURBID (A) 05/08/2017 2110   LABSPEC 1.014 05/08/2017 2110   PHURINE 5.0 05/08/2017 2110   GLUCOSEU 50 (A) 05/08/2017 2110   HGBUR LARGE (A) 05/08/2017 2110   BILIRUBINUR NEGATIVE 05/08/2017 2110   KETONESUR NEGATIVE 05/08/2017 2110   PROTEINUR 100 (A) 05/08/2017 2110   NITRITE POSITIVE (A) 05/08/2017 2110   LEUKOCYTESUR LARGE (A) 05/08/2017 2110   Sepsis Labs: @LABRCNTIP (procalcitonin:4,lacticidven:4) )No results found for this or any previous visit (from the past 240 hour(s)).   Radiological Exams on Admission: Ct Renal Stone Study  Result Date: 05/09/2017 CLINICAL DATA:  Acute onset of right back pain. Gross hematuria. Large amount of white blood cells and white blood cells in the urine. EXAM: CT ABDOMEN AND PELVIS WITHOUT CONTRAST TECHNIQUE: Multidetector CT imaging of the abdomen and pelvis was performed following the standard protocol without IV contrast. COMPARISON:  CT of the chest, abdomen and pelvis performed 02/21/2017 FINDINGS: Lower chest: Mild bibasilar scarring is noted. Scattered coronary artery calcifications are seen. Hepatobiliary: The liver is unremarkable in appearance. The gallbladder is unremarkable in appearance. The common bile duct remains normal in caliber. Pancreas: The pancreas is within normal limits. Spleen: The spleen is unremarkable in appearance. Adrenals/Urinary Tract: The adrenal glands are unremarkable in appearance. Minimal left-sided hydronephrosis is noted, without evidence of distal obstruction. Nonspecific perinephric stranding is noted bilaterally. No renal or ureteral stones are identified. Stomach/Bowel: The stomach is unremarkable in appearance. The small bowel is within normal limits. The  appendix is not visualized; there is no evidence for appendicitis. The colon is unremarkable in appearance. Vascular/Lymphatic: Diffuse calcification is seen along the abdominal aorta and its branches. The abdominal aorta is otherwise grossly unremarkable. The inferior vena cava is grossly unremarkable. No retroperitoneal lymphadenopathy is seen. No pelvic sidewall lymphadenopathy is identified. Reproductive: Marked wall thickening is noted along the bladder, more prominent anteriorly. This may reflect severe  cystitis. Underlying mass cannot be excluded. The prostate remains normal in size. A Foley catheter is noted in expected position. Other: No additional soft tissue abnormalities are seen. Musculoskeletal: No acute osseous abnormalities are identified. Lumbar spinal fusion is noted at T12-L2. Multilevel vacuum phenomenon is noted along the lower thoracic and lumbar spine. The visualized musculature is unremarkable in appearance. IMPRESSION: 1. Marked bladder wall thickening, more prominent anteriorly. This may reflect severe cystitis. Underlying mass cannot be excluded. Would correlate with the patient's lab findings, and consider cystoscopy if deemed clinically appropriate. 2. Minimal left-sided hydronephrosis, without evidence of distal obstruction. 3. Scattered coronary artery calcifications seen. 4. Degenerative change along the lower thoracic and lumbar spine. Aortic Atherosclerosis (ICD10-I70.0). Electronically Signed   By: Roanna RaiderJeffery  Chang M.D.   On: 05/09/2017 02:21      Assessment/Plan Principal Problem:   Acute lower UTI Active Problems:   Acute urinary retention   Chronic anticoagulation   Acute renal failure (ARF) (HCC)   Acute blood loss anemia   Diabetes mellitus type 2, controlled (HCC)   Acute GI bleeding   Hypothyroidism   Essential hypertension   Acute UTI    1. Hematuria with urine retention on Foley catheter with possible UTI -in the setting of Coumadin.  Will hold Coumadin  and recheck hemoglobin.  Patient on ceftriaxone.  Urine cultures to be followed.  Patient needs referral to urologist. 2. Possible GI bleed -patient has history of ulcerative colitis on sulfasalazine.  Patient did not notice any obvious GI bleed.  We will hold Coumadin and aspirin but did take ibuprofen for cervical call fracture we will keep patient on Protonix.  May need GI consult. 3. Acute blood loss anemia probably from hematuria and possible GI bleed -holding Coumadin and if there is further decline in hemoglobin will need reversal of INR and also holding of aspirin.  Type and screen. 4. Atrial fibrillation -rate controlled presently holding off Coumadin secondary to hematuria and possible GI bleed. 5. Diabetes mellitus type 2 -hold metformin due to acute renal failure.  On sliding scale coverage. 6. Acute renal failure probably secondary to -patient recently using ibuprofen then patient is also on ARB and hydrochlorothiazide and possible early urinary retention contributing to some.  Will discontinue ibuprofen hold ARB and hydrochlorothiazide gentle hydration follow CBC closely follow intake output.  There was mild hydronephrosis on the left side with no obvious obstruction. 7. Hypothyroidism on Synthroid. 8. Hyperlipidemia on statins. 9. Hypertension on amlodipine.   DVT prophylaxis: SCDs. Code Status: Full code. Family Communication: Discussed with patient. Disposition Plan: Home. Consults called: None. Admission status: Observation.   Eduard ClosArshad N Ollis Daudelin MD [facility] Pager [phone].  If 7PM-7AM, please contact night-coverage [email or web address] Password [email or web address]  05/09/2017, 3:30 AM

## 2017-05-10 ENCOUNTER — Encounter (HOSPITAL_COMMUNITY): Payer: Self-pay

## 2017-05-10 DIAGNOSIS — Z7901 Long term (current) use of anticoagulants: Secondary | ICD-10-CM

## 2017-05-10 DIAGNOSIS — N390 Urinary tract infection, site not specified: Principal | ICD-10-CM

## 2017-05-10 DIAGNOSIS — I10 Essential (primary) hypertension: Secondary | ICD-10-CM

## 2017-05-10 DIAGNOSIS — R627 Adult failure to thrive: Secondary | ICD-10-CM

## 2017-05-10 DIAGNOSIS — N179 Acute kidney failure, unspecified: Secondary | ICD-10-CM

## 2017-05-10 DIAGNOSIS — R319 Hematuria, unspecified: Secondary | ICD-10-CM

## 2017-05-10 DIAGNOSIS — D62 Acute posthemorrhagic anemia: Secondary | ICD-10-CM

## 2017-05-10 LAB — BASIC METABOLIC PANEL
ANION GAP: 8 (ref 5–15)
BUN: 39 mg/dL — ABNORMAL HIGH (ref 6–20)
CALCIUM: 8.4 mg/dL — AB (ref 8.9–10.3)
CO2: 26 mmol/L (ref 22–32)
Chloride: 102 mmol/L (ref 101–111)
Creatinine, Ser: 1.49 mg/dL — ABNORMAL HIGH (ref 0.61–1.24)
GFR, EST AFRICAN AMERICAN: 46 mL/min — AB (ref >60)
GFR, EST NON AFRICAN AMERICAN: 40 mL/min — AB (ref >60)
Glucose, Bld: 177 mg/dL — ABNORMAL HIGH (ref 65–99)
POTASSIUM: 3.7 mmol/L (ref 3.5–5.1)
Sodium: 136 mmol/L (ref 135–145)

## 2017-05-10 LAB — GLUCOSE, CAPILLARY
GLUCOSE-CAPILLARY: 222 mg/dL — AB (ref 65–99)
GLUCOSE-CAPILLARY: 199 mg/dL — AB (ref 65–99)
GLUCOSE-CAPILLARY: 304 mg/dL — AB (ref 65–99)
Glucose-Capillary: 169 mg/dL — ABNORMAL HIGH (ref 65–99)

## 2017-05-10 LAB — CBC WITH DIFFERENTIAL/PLATELET
Basophils Absolute: 0 10*3/uL (ref 0.0–0.1)
Basophils Relative: 0 %
EOS ABS: 0.2 10*3/uL (ref 0.0–0.7)
Eosinophils Relative: 2 %
HEMATOCRIT: 22.1 % — AB (ref 39.0–52.0)
HEMOGLOBIN: 7.4 g/dL — AB (ref 13.0–17.0)
LYMPHS ABS: 1.8 10*3/uL (ref 0.7–4.0)
LYMPHS PCT: 22 %
MCH: 32 pg (ref 26.0–34.0)
MCHC: 33.5 g/dL (ref 30.0–36.0)
MCV: 95.7 fL (ref 78.0–100.0)
MONOS PCT: 12 %
Monocytes Absolute: 1 10*3/uL (ref 0.1–1.0)
NEUTROS ABS: 5.1 10*3/uL (ref 1.7–7.7)
NEUTROS PCT: 64 %
Platelets: 247 10*3/uL (ref 150–400)
RBC: 2.31 MIL/uL — AB (ref 4.22–5.81)
RDW: 16.9 % — ABNORMAL HIGH (ref 11.5–15.5)
WBC: 8 10*3/uL (ref 4.0–10.5)

## 2017-05-10 LAB — MAGNESIUM: Magnesium: 2.1 mg/dL (ref 1.7–2.4)

## 2017-05-10 LAB — PROTIME-INR
INR: 1.5
PROTHROMBIN TIME: 18 s — AB (ref 11.4–15.2)

## 2017-05-10 MED ORDER — WARFARIN - PHARMACIST DOSING INPATIENT: Freq: Every day | Status: DC

## 2017-05-10 MED ORDER — WARFARIN SODIUM 5 MG PO TABS
5.0000 mg | ORAL_TABLET | Freq: Once | ORAL | Status: AC
  Administered 2017-05-10: 5 mg via ORAL
  Filled 2017-05-10: qty 1

## 2017-05-10 NOTE — Final Consult Note (Signed)
Consultant Final Sign-Off Note    Assessment/Final recommendations  Justin LisRobert B Salazar is a 83 y.o. male followed by me for urinary retention with gross hematuria.  His hematuria has now cleared and therefore he can continue anticoagulation.  His hemoglobin has been stable. His creatinine has improved with Foley catheter drainage.  This was likely secondary to his retention as well as possibly due to renal and prerenal factors as well. His mild left hydronephrosis will be followed up as an outpatient. Follow-up information has been placed on the chart.   Wound care (if applicable): Foley catheter will remain upon discharge with removal as an outpatient at my office.   Diet at discharge: per primary team   Activity at discharge: per primary team   Follow-up appointment:     Pending results:  Unresulted Labs (From admission, onward)   Start     Ordered   05/10/17 0500  CBC with Differential/Platelet  Daily,   R    Question:  Specimen collection method  Answer:  Lab=Lab collect   05/09/17 1517   05/10/17 0500  Protime-INR  Daily,   R    Question:  Specimen collection method  Answer:  Lab=Lab collect   05/09/17 1517   05/10/17 0500  Basic metabolic panel  Daily,   R    Question:  Specimen collection method  Answer:  Lab=Lab collect   05/09/17 1517   05/08/17 2232  Urine culture  STAT,   STAT     05/08/17 2231       Medication recommendations:   Other recommendations:    Thank you for allowing us to participate in the care of your patient!  Please consult us again if you have further needs for your patient.  Justin Salazar 05/10/2017 7:27 AM    Subjective   No complaints.  Specifically tolerating his Foley catheter well with no abdominal discomfort or flank pain.  Objective  Vital signs in last 24 hours: Temp:  [97.8 F (36.6 Salazar)-98.9 F (37.2 Salazar)] 98.9 F (37.2 Salazar) (11/17 0433) Pulse Rate:  [62-83] 69 (11/17 0433) Resp:  [16-20] 20 (11/17 0433) BP: (120-130)/(53-67)  130/58 (11/17 0433) SpO2:  [94 %-98 %] 94 % (11/17 0433) Weight:  [74.7 kg (164 lb 9.6 oz)] 74.7 kg (164 lb 9.6 oz) (11/17 0433)  General: Patient is awake, alert and in no distress. Abdomen is soft and nontender. Foley catheter is indwelling and the urine is now completely clear.   Pertinent labs and Studies: Recent Labs    05/09/17 0716 05/09/17 1111 05/10/17 0512  WBC 7.7 6.2 8.0  HGB 7.8* 7.6* 7.4*  HCT 23.2* 22.6* 22.1*   BMET Recent Labs    05/09/17 1355 05/10/17 0512  NA 130* 136  K 3.5 3.7  CL 97* 102  CO2 22 26  GLUCOSE 359* 177*  BUN 50* 39*  CREATININE 2.03* 1.49*  CALCIUM 8.1* 8.4*   No results for input(s): LABURIN in the last 72 hours. No results found for this or any previous visit.  Imaging: No results found.

## 2017-05-10 NOTE — Progress Notes (Signed)
ANTICOAGULATION CONSULT NOTE - Initial Consult  Pharmacy Consult for Warfarin Indication: atrial fibrillation  No Known Allergies  Patient Measurements: Height: 5\' 9"  (175.3 cm) Weight: 164 lb 9.6 oz (74.7 kg) IBW/kg (Calculated) : 70.7  Vital Signs: Temp: 98.5 F (36.9 C) (11/17 1304) Temp Source: Oral (11/17 1304) BP: 128/42 (11/17 1304) Pulse Rate: 66 (11/17 1304)  Labs: Recent Labs    05/09/17 0208  05/09/17 0716 05/09/17 1111 05/09/17 1355 05/10/17 0512  HGB  --    < > 7.8* 7.6*  --  7.4*  HCT  --    < > 23.2* 22.6*  --  22.1*  PLT  --    < > 239 238  --  247  LABPROT 29.0*  --   --   --   --  18.0*  INR 2.77  --   --   --   --  1.50  CREATININE  --   --  2.35*  --  2.03* 1.49*   < > = values in this interval not displayed.    Estimated Creatinine Clearance: 34.3 mL/min (A) (by C-G formula based on SCr of 1.49 mg/dL (H)).   Medical History: Past Medical History:  Diagnosis Date  . Coronary artery disease   . Diabetes mellitus without complication (HCC)   . GERD (gastroesophageal reflux disease)   . Hypertension   . Thyroid disease     Medications:  Scheduled:  . atorvastatin  20 mg Oral q1800  . doxepin  10 mg Oral QHS  . feeding supplement (ENSURE ENLIVE)  237 mL Oral BID BM  . ferrous sulfate  325 mg Oral Q breakfast  . gabapentin  100 mg Oral QHS  . insulin aspart  0-9 Units Subcutaneous TID WC  . insulin aspart  3 Units Subcutaneous TID WC  . insulin glargine  5 Units Subcutaneous QHS  . levothyroxine  175 mcg Oral QAC breakfast  . lipase/protease/amylase  36,000 Units Oral TID AC  . metoprolol succinate  25 mg Oral Daily  . oxybutynin  10 mg Oral QHS  . pantoprazole (PROTONIX) IV  40 mg Intravenous Q24H  . sertraline  100 mg Oral Daily  . sulfaSALAzine  1,000 mg Oral BID   Infusions:  . cefTRIAXone (ROCEPHIN)  IV Stopped (05/10/17 0107)    Assessment: [age] yoM admitted on 11/16 with UTI, hematuria.  PMH includes chronic warfarin  anticoagulation for Afib; warfarin was held on admission for acute blood loss anemia.  Hematuria is now resolved on 11/17 and pharmacy is consulted to resume warfarin dosing.  PTA warfarin 5 mg daily except 6 mg on Mon, Wed, Fridays.  Last dose on 11/15. Admission INR 2.77  Today, 05/10/2017:  INR 1.5, decreased to subtherapeutic  SCr improving, 1.49  CBC: Hgb 7.4 remains low/stable, Plt WNL  No bleeding or complications reported  Diet: heart health, carb modified, no meal intake recorded.  Drug-drug interactions: broad spectrum antibiotics (ceftriaxone) may prolong INR  Goal of Therapy:  INR 2-3 Monitor platelets by anticoagulation protocol: Yes   Plan:  Warfarin 5 mg PO x 1. Daily PT/INR. Monitor for signs and symptoms of bleeding.  Lynann Beaverhristine Aldrin Engelhard PharmD, BCPS Pager [phone] 05/10/2017 1:56 PM

## 2017-05-10 NOTE — Evaluation (Signed)
Physical Therapy Evaluation Patient Details Name: Justin LisRobert B Weida MRN: [id number] DOB: 03/14/1927 Today's Date: 05/10/2017   History of Present Illness  83 y.o. male with history of atrial fibrillation, ulcerative colitis, hypertension, diabetes, hypothyroidism who has had a recent fall and had cervical fracture (has hard cervical collar in place) also had a recent nasal fracture surgery and patient is on Coumadin.  Pt admitted for gross hematuria with urinary retention  Clinical Impression  Pt admitted with above diagnosis. Pt currently with functional limitations due to the deficits listed below (see PT Problem List).  Pt will benefit from skilled PT to increase their independence and safety with mobility to allow discharge to the venue listed below.  Pt reports fall in Sept with resulting C3 fx and continues to wear hard collar (he believes until December) as well as nasal fx s/p ORIF.  Pt reports he does not have good balance and had attempted PT for this but was not pleased with progress or payment.  May benefit from HHPT upon d/c if pt agreeable.     Follow Up Recommendations Home health PT    Equipment Recommendations  None recommended by PT    Recommendations for Other Services       Precautions / Restrictions Precautions Precautions: Fall Required Braces or Orthoses: Cervical Brace Cervical Brace: Hard collar      Mobility  Bed Mobility Overal bed mobility: Needs Assistance Bed Mobility: Supine to Sit     Supine to sit: Min assist;HOB elevated     General bed mobility comments: assist for trunk upright  Transfers Overall transfer level: Needs assistance Equipment used: Rolling walker (2 wheeled) Transfers: Sit to/from Stand Sit to Stand: Min guard         General transfer comment: increased time, utilizes UE assist, min/guard for safety  Ambulation/Gait Ambulation/Gait assistance: Min guard Ambulation Distance (Feet): 120 Feet Assistive device: Rolling  walker (2 wheeled) Gait Pattern/deviations: Step-through pattern;Decreased stride length     General Gait Details: steady with RW, distance to pt tolerance  Stairs            Wheelchair Mobility    Modified Rankin (Stroke Patients Only)       Balance Overall balance assessment: History of Falls(denies any falls after recent fall with C3 fx and nasal fx)                                           Pertinent Vitals/Pain Pain Assessment: No/denies pain    Home Living Family/patient expects to be discharged to:: Private residence Living Arrangements: Alone   Type of Home: House       Home Layout: One level Home Equipment: Environmental consultantWalker - 4 wheels;Cane - single point      Prior Function Level of Independence: Independent with assistive device(s)         Comments: uses rollator     Hand Dominance        Extremity/Trunk Assessment        Lower Extremity Assessment Lower Extremity Assessment: Generalized weakness    Cervical / Trunk Assessment Cervical / Trunk Assessment: Kyphotic  Communication   Communication: No difficulties  Cognition Arousal/Alertness: Awake/alert Behavior During Therapy: WFL for tasks assessed/performed Overall Cognitive Status: Within Functional Limits for tasks assessed  General Comments      Exercises     Assessment/Plan    PT Assessment Patient needs continued PT services  PT Problem List Decreased strength;Decreased mobility;Decreased balance;Decreased knowledge of use of DME;Decreased activity tolerance       PT Treatment Interventions Gait training;DME instruction;Therapeutic activities;Therapeutic exercise;Functional mobility training;Patient/family education    PT Goals (Current goals can be found in the Care Plan section)  Acute Rehab PT Goals PT Goal Formulation: With patient Time For Goal Achievement: 05/17/17 Potential to Achieve Goals:  Good    Frequency Min 3X/week   Barriers to discharge        Co-evaluation               AM-PAC PT "6 Clicks" Daily Activity  Outcome Measure Difficulty turning over in bed (including adjusting bedclothes, sheets and blankets)?: A Lot Difficulty moving from lying on back to sitting on the side of the bed? : Unable Difficulty sitting down on and standing up from a chair with arms (e.g., wheelchair, bedside commode, etc,.)?: Unable Help needed moving to and from a bed to chair (including a wheelchair)?: A Little Help needed walking in hospital room?: A Little Help needed climbing 3-5 steps with a railing? : A Little 6 Click Score: 13    End of Session Equipment Utilized During Treatment: Gait belt;Cervical collar Activity Tolerance: Patient tolerated treatment well Patient left: in chair;with call bell/phone within reach;with nursing/sitter in room;with chair alarm set Nurse Communication: Mobility status PT Visit Diagnosis: Difficulty in walking, not elsewhere classified (R26.2)    Time: [id number] PT Time Calculation (min) (ACUTE ONLY): 12 min   Charges:   PT Evaluation $PT Eval Low Complexity: 1 Low     PT G CodesZenovia Jarred:        Kati Freda Jaquith, PT, DPT 05/10/2017 Pager: [phone]  Maida SaleLEMYRE,KATHrine E 05/10/2017, 12:47 PM

## 2017-05-10 NOTE — Progress Notes (Signed)
Patient ID: Justin Salazar, male   DOB: 1927-03-11, 83 y.o.   MRN: [id number]  PROGRESS NOTE    Justin Salazar  WUJ:[id number] DOB: 04-12-1927 DOA: 05/08/2017 PCP: Vivien Presto, MD   Brief Narrative:  83 year old male with history of atrial fibrillation on anticoagulation, ulcerative colitis, hypertension, diabetes, hypothyroidism, recent fall and cervical fracture on cervical collar, recent nasal fracture surgery presented on 05/08/2017 with worsening hematuria.  CT of the abdomen showed cystitis with some mild left-sided hydronephrosis.  Patient was started on IV antibiotics and urology was consulted.  Foley catheter was placed.  Patient had acute kidney injury on presentation.   Assessment & Plan:   Principal Problem:   Acute lower UTI Active Problems:   Acute urinary retention   Chronic anticoagulation   Acute renal failure (ARF) (HCC)   Acute blood loss anemia   Diabetes mellitus type 2, controlled (HCC)   Acute GI bleeding   Hypothyroidism   Essential hypertension   Acute UTI   Hematuria  Acute urinary retention with gross hematuria and left-sided hydronephrosis with urinary tract infection -Urology following.  Foley catheter will be continued.  Outpatient follow-up with urology - Continue Rocephin.  Culture negative so far.  Probable switch to oral antibiotics to finish 7-10-day course of therapy -Hematuria improving.  Okay to restart anticoagulation as per urology.  Will consult pharmacy for the same.  Monitor hemoglobin  Acute kidney injury -Probably secondary to above.  Improving.  Repeat a.m. Labs  Blood loss anemia -Probably from gross hematuria -Hemoglobin 7.4 this morning.  Repeat a.m. hemoglobin and transfuse if hemoglobin less than 7.  Hematuria improving -Outpatient follow-up with GI with regards to occult blood positive stool.  Patient denies black stool or melena -Okay to restart anticoagulation as per urology  Paroxysmal atrial fibrillation -Rate  controlled.  Continue metoprolol.  Restart anticoagulation.  Monitor INR  Hypertension -Monitor blood pressure.  Continue Toprol.  Hold Norvasc/lisinopril/hydrochlorothiazide  Diabetes mellitus type 2 -Continue Accu-Cheks with coverage.  Hold home oral meds -Outpatient follow-up  Failure to thrive with falls at home -PT evaluation.  Continue neck collar.  Care management consult for probable home care needs   DVT prophylaxis: Coumadin on hold Code Status:  Full Family Communication: None at bedside Disposition Plan: Probable home in the next 1-2 days  Consultants: Urology  Procedures: None  Antimicrobials: Rocephin from 05/08/2017 onwards   Subjective: Patient seen and examined at bedside.  He denies any fever, nausea or vomiting.  He feels weak.  Objective: Vitals:   05/09/17 1012 05/09/17 1301 05/09/17 2202 05/10/17 0433  BP: 121/67 (!) 123/58 (!) 120/53 (!) 130/58  Pulse: 83 81 62 69  Resp: 16 16 18 20   Temp:  97.8 F (36.6 C) 98.9 F (37.2 C) 98.9 F (37.2 C)  TempSrc:  Oral Oral Oral  SpO2: 97% 98% 97% 94%  Weight:    74.7 kg (164 lb 9.6 oz)  Height:        Intake/Output Summary (Last 24 hours) at 05/10/2017 1229 Last data filed at 05/10/2017 0600 Gross per 24 hour  Intake 802.5 ml  Output 1800 ml  Net -997.5 ml   Filed Weights   05/08/17 2108 05/09/17 0509 05/10/17 0433  Weight: 74.8 kg (165 lb) 74.9 kg (165 lb 2 oz) 74.7 kg (164 lb 9.6 oz)    Examination:  General exam: Appears calm and comfortable  Respiratory system: Bilateral decreased breath sound at bases Cardiovascular system: S1 & S2 heard, rate controlled  gastrointestinal system: Abdomen is nondistended, soft and nontender. Normal bowel sounds heard. Extremities: No cyanosis, clubbing, edema   Data Reviewed: I have personally reviewed following labs and imaging studies  CBC: Recent Labs  Lab 05/09/17 0057 05/09/17 0059 05/09/17 0440 05/09/17 0716 05/09/17 1111 05/10/17 0512    WBC 9.0  --  9.2 7.7 6.2 8.0  NEUTROABS 6.0  --   --   --   --  5.1  HGB 7.7* 8.2* 7.8* 7.8* 7.6* 7.4*  HCT 23.2* 24.0* 23.9* 23.2* 22.6* 22.1*  MCV 94.3  --  94.5 93.9 93.8 95.7  PLT 267  --  263 239 238 247   Basic Metabolic Panel: Recent Labs  Lab 05/09/17 0059 05/09/17 0716 05/09/17 1355 05/10/17 0512  NA 132* 132* 130* 136  K 3.9 3.8 3.5 3.7  CL 97* 99* 97* 102  CO2  --  24 22 26   GLUCOSE 204* 195* 359* 177*  BUN 57* 58* 50* 39*  CREATININE 3.00* 2.35* 2.03* 1.49*  CALCIUM  --  8.2* 8.1* 8.4*  MG  --   --  1.6* 2.1   GFR: Estimated Creatinine Clearance: 34.3 mL/min (A) (by C-G formula based on SCr of 1.49 mg/dL (H)). Liver Function Tests: No results for input(s): AST, ALT, ALKPHOS, BILITOT, PROT, ALBUMIN in the last 168 hours. No results for input(s): LIPASE, AMYLASE in the last 168 hours. No results for input(s): AMMONIA in the last 168 hours. Coagulation Profile: Recent Labs  Lab 05/09/17 0208 05/10/17 0512  INR 2.77 1.50   Cardiac Enzymes: No results for input(s): CKTOTAL, CKMB, CKMBINDEX, TROPONINI in the last 168 hours. BNP (last 3 results) No results for input(s): PROBNP in the last 8760 hours. HbA1C: Recent Labs    05/09/17 1111  HGBA1C 6.4*   CBG: Recent Labs  Lab 05/09/17 1201 05/09/17 1643 05/09/17 2121 05/10/17 0738 05/10/17 1136  GLUCAP 219* 241* 132* 169* 222*   Lipid Profile: No results for input(s): CHOL, HDL, LDLCALC, TRIG, CHOLHDL, LDLDIRECT in the last 72 hours. Thyroid Function Tests: No results for input(s): TSH, T4TOTAL, FREET4, T3FREE, THYROIDAB in the last 72 hours. Anemia Panel: No results for input(s): VITAMINB12, FOLATE, FERRITIN, TIBC, IRON, RETICCTPCT in the last 72 hours. Sepsis Labs: No results for input(s): PROCALCITON, LATICACIDVEN in the last 168 hours.  No results found for this or any previous visit (from the past 240 hour(s)).       Radiology Studies: Ct Renal Stone Study  Result Date:  05/09/2017 CLINICAL DATA:  Acute onset of right back pain. Gross hematuria. Large amount of white blood cells and white blood cells in the urine. EXAM: CT ABDOMEN AND PELVIS WITHOUT CONTRAST TECHNIQUE: Multidetector CT imaging of the abdomen and pelvis was performed following the standard protocol without IV contrast. COMPARISON:  CT of the chest, abdomen and pelvis performed 02/21/2017 FINDINGS: Lower chest: Mild bibasilar scarring is noted. Scattered coronary artery calcifications are seen. Hepatobiliary: The liver is unremarkable in appearance. The gallbladder is unremarkable in appearance. The common bile duct remains normal in caliber. Pancreas: The pancreas is within normal limits. Spleen: The spleen is unremarkable in appearance. Adrenals/Urinary Tract: The adrenal glands are unremarkable in appearance. Minimal left-sided hydronephrosis is noted, without evidence of distal obstruction. Nonspecific perinephric stranding is noted bilaterally. No renal or ureteral stones are identified. Stomach/Bowel: The stomach is unremarkable in appearance. The small bowel is within normal limits. The appendix is not visualized; there is no evidence for appendicitis. The colon is unremarkable in appearance. Vascular/Lymphatic:  Diffuse calcification is seen along the abdominal aorta and its branches. The abdominal aorta is otherwise grossly unremarkable. The inferior vena cava is grossly unremarkable. No retroperitoneal lymphadenopathy is seen. No pelvic sidewall lymphadenopathy is identified. Reproductive: Marked wall thickening is noted along the bladder, more prominent anteriorly. This may reflect severe cystitis. Underlying mass cannot be excluded. The prostate remains normal in size. A Foley catheter is noted in expected position. Other: No additional soft tissue abnormalities are seen. Musculoskeletal: No acute osseous abnormalities are identified. Lumbar spinal fusion is noted at T12-L2. Multilevel vacuum phenomenon is  noted along the lower thoracic and lumbar spine. The visualized musculature is unremarkable in appearance. IMPRESSION: 1. Marked bladder wall thickening, more prominent anteriorly. This may reflect severe cystitis. Underlying mass cannot be excluded. Would correlate with the patient's lab findings, and consider cystoscopy if deemed clinically appropriate. 2. Minimal left-sided hydronephrosis, without evidence of distal obstruction. 3. Scattered coronary artery calcifications seen. 4. Degenerative change along the lower thoracic and lumbar spine. Aortic Atherosclerosis (ICD10-I70.0). Electronically Signed   By: Roanna RaiderJeffery  Chang M.D.   On: 05/09/2017 02:21        Scheduled Meds: . atorvastatin  20 mg Oral q1800  . doxepin  10 mg Oral QHS  . feeding supplement (ENSURE ENLIVE)  237 mL Oral BID BM  . ferrous sulfate  325 mg Oral Q breakfast  . gabapentin  100 mg Oral QHS  . insulin aspart  0-9 Units Subcutaneous TID WC  . insulin aspart  3 Units Subcutaneous TID WC  . insulin glargine  5 Units Subcutaneous QHS  . levothyroxine  175 mcg Oral QAC breakfast  . lipase/protease/amylase  36,000 Units Oral TID AC  . metoprolol succinate  25 mg Oral Daily  . oxybutynin  10 mg Oral QHS  . pantoprazole (PROTONIX) IV  40 mg Intravenous Q24H  . sertraline  100 mg Oral Daily  . sulfaSALAzine  1,000 mg Oral BID   Continuous Infusions: . cefTRIAXone (ROCEPHIN)  IV Stopped (05/10/17 0107)     LOS: 1 day        Glade LloydKshitiz Shirell Struthers, MD [facility] Pager [phone]  If 7PM-7AM, please contact night-coverage [email or web address] Password [email or web address] 05/10/2017, 12:29 PM

## 2017-05-11 DIAGNOSIS — E118 Type 2 diabetes mellitus with unspecified complications: Secondary | ICD-10-CM

## 2017-05-11 DIAGNOSIS — R338 Other retention of urine: Secondary | ICD-10-CM

## 2017-05-11 LAB — CBC WITH DIFFERENTIAL/PLATELET
BASOS ABS: 0 10*3/uL (ref 0.0–0.1)
Basophils Relative: 0 %
EOS PCT: 1 %
Eosinophils Absolute: 0.2 10*3/uL (ref 0.0–0.7)
HCT: 25.3 % — ABNORMAL LOW (ref 39.0–52.0)
Hemoglobin: 8.3 g/dL — ABNORMAL LOW (ref 13.0–17.0)
LYMPHS PCT: 11 %
Lymphs Abs: 1.5 10*3/uL (ref 0.7–4.0)
MCH: 31.7 pg (ref 26.0–34.0)
MCHC: 32.8 g/dL (ref 30.0–36.0)
MCV: 96.6 fL (ref 78.0–100.0)
MONO ABS: 1.6 10*3/uL — AB (ref 0.1–1.0)
MONOS PCT: 13 %
Neutro Abs: 9.6 10*3/uL — ABNORMAL HIGH (ref 1.7–7.7)
Neutrophils Relative %: 75 %
PLATELETS: 253 10*3/uL (ref 150–400)
RBC: 2.62 MIL/uL — ABNORMAL LOW (ref 4.22–5.81)
RDW: 17 % — AB (ref 11.5–15.5)
WBC: 12.9 10*3/uL — ABNORMAL HIGH (ref 4.0–10.5)

## 2017-05-11 LAB — PROTIME-INR
INR: 1.12
Prothrombin Time: 14.3 seconds (ref 11.4–15.2)

## 2017-05-11 LAB — MAGNESIUM: Magnesium: 1.6 mg/dL — ABNORMAL LOW (ref 1.7–2.4)

## 2017-05-11 LAB — BASIC METABOLIC PANEL
Anion gap: 7 (ref 5–15)
BUN: 26 mg/dL — AB (ref 6–20)
CHLORIDE: 99 mmol/L — AB (ref 101–111)
CO2: 29 mmol/L (ref 22–32)
CREATININE: 1.32 mg/dL — AB (ref 0.61–1.24)
Calcium: 8.7 mg/dL — ABNORMAL LOW (ref 8.9–10.3)
GFR calc Af Amer: 54 mL/min — ABNORMAL LOW (ref >60)
GFR calc non Af Amer: 46 mL/min — ABNORMAL LOW (ref >60)
GLUCOSE: 182 mg/dL — AB (ref 65–99)
Potassium: 3.2 mmol/L — ABNORMAL LOW (ref 3.5–5.1)
Sodium: 135 mmol/L (ref 135–145)

## 2017-05-11 LAB — URINE CULTURE: Culture: >=100000 — AB

## 2017-05-11 LAB — GLUCOSE, CAPILLARY
Glucose-Capillary: 205 mg/dL — ABNORMAL HIGH (ref 65–99)
Glucose-Capillary: 327 mg/dL — ABNORMAL HIGH (ref 65–99)

## 2017-05-11 MED ORDER — MAGNESIUM SULFATE 2 GM/50ML IV SOLN
2.0000 g | Freq: Once | INTRAVENOUS | Status: AC
  Administered 2017-05-11: 2 g via INTRAVENOUS
  Filled 2017-05-11: qty 50

## 2017-05-11 MED ORDER — POTASSIUM CHLORIDE CRYS ER 20 MEQ PO TBCR
60.0000 meq | EXTENDED_RELEASE_TABLET | Freq: Once | ORAL | Status: AC
  Administered 2017-05-11: 60 meq via ORAL
  Filled 2017-05-11: qty 3

## 2017-05-11 MED ORDER — OXYBUTYNIN CHLORIDE ER 10 MG PO TB24: 10.0000 mg | ORAL_TABLET | Freq: Every day | ORAL | 0 refills | Status: AC

## 2017-05-11 MED ORDER — DOXYCYCLINE HYCLATE 100 MG PO TABS: 100.0000 mg | ORAL_TABLET | Freq: Two times a day (BID) | ORAL | 0 refills | Status: DC

## 2017-05-11 NOTE — Care Management Important Message (Signed)
Important Message  Patient Details  Name: Justin LisRobert B Clugston MRN: [id number] Date of Birth: 01/27/1927   Medicare Important Message Given:  Yes    Elliot CousinShavis, Gracelyn Coventry Ellen, RN 05/11/2017, 10:53 AM

## 2017-05-11 NOTE — Discharge Summary (Signed)
Physician Discharge Summary  Darra LisRobert B Krakowski ZOX:[id number] DOB: 05/18/1927 DOA: 05/08/2017  PCP: Vivien Prestoorrington, Kip A, MD  Admit date: 05/08/2017 Discharge date: 05/11/2017  Admitted From: Home Disposition: Home  Recommendations for Outpatient Follow-up:  1. Follow up with PCP in 1 week with repeat CBC/BMP 2. Follow-up with repeat INR at earliest convenience.  Coumadin dose will have to be adjusted depending on the repeat INR by the primary care provider 3. Follow-up with urology in 1-2 weeks.  Continue Foley's catheter upon discharge  Home Health: Yes Equipment/Devices: Foley's catheter; neck collar Discharge Condition: Stable CODE STATUS: Full Diet recommendation: Heart Healthy / Carb Modified   Brief/Interim Summary: 83 year old male with history of atrial fibrillation on anticoagulation, ulcerative colitis, hypertension, diabetes, hypothyroidism, recent fall and cervical fracture on cervical collar, recent nasal fracture surgery presented on 05/08/2017 with worsening hematuria.  CT of the abdomen showed cystitis with some mild left-sided hydronephrosis.  Patient was started on IV antibiotics and urology was consulted.  Foley catheter was placed.  Patient had acute kidney injury on presentation.  Urology has cleared the patient for discharge with Foley catheter in place and outpatient follow-up with urology.  Coumadin was restarted upon urology recommendations.  Hematuria is improving.    Discharge Diagnoses:  Principal Problem:   Acute lower UTI Active Problems:   Acute urinary retention   Chronic anticoagulation   Acute renal failure (ARF) (HCC)   Acute blood loss anemia   Diabetes mellitus type 2, controlled (HCC)   Acute GI bleeding   Hypothyroidism   Essential hypertension   Acute UTI   Hematuria   Acute urinary retention with gross hematuria and left-sided hydronephrosis with urinary tract infection -Status post Foley catheter placement.  Urology has evaluated the  patient and recommends that Foley catheter be continued upon discharge.  Outpatient follow-up with urology.  Coumadin was restarted upon urology recommendations.  -Urine culture is growing MRSA; currently on Rocephin but not spiking fevers.  Discharge home on doxycycline 100 mg p.o. twice daily for 10 days -Hematuria improving.   -Hemoglobin stable currently.  Outpatient follow-up of hemoglobin   Acute kidney injury -Probably secondary to above.  Improving.    Outpatient follow-up -Discontinue ibuprofen, lisinopril/hydrochlorothiazide  Blood loss anemia -Probably from gross hematuria -Hemoglobin 8.3 this morning.   -Outpatient follow-up with GI with regards to occult blood positive stool.  Patient denies black stool or melena -Outpatient follow-up  Paroxysmal atrial fibrillation -Rate controlled.  Continue metoprolol.    Continue Coumadin.  Follow INR at earliest convenience as an outpatient  Hypertension -Monitor blood pressure.  Continue Toprol.    Restart Norvasc.  Hold lisinopril/hydrochlorothiazide  Diabetes mellitus type 2 -Hold metformin until evaluated by primary care provider.  Continue Januvia  Failure to thrive with falls at home -Outpatient PT evaluation. Continue neck collar.     Discharge Instructions  Discharge Instructions    Call MD for:  difficulty breathing, headache or visual disturbances   Complete by:  As directed    Call MD for:  extreme fatigue   Complete by:  As directed    Call MD for:  hives   Complete by:  As directed    Call MD for:  persistant dizziness or light-headedness   Complete by:  As directed    Call MD for:  persistant nausea and vomiting   Complete by:  As directed    Call MD for:  severe uncontrolled pain   Complete by:  As directed    Call MD  for:  temperature >100.4   Complete by:  As directed    Diet - low sodium heart healthy   Complete by:  As directed    Diet Carb Modified   Complete by:  As directed    Increase  activity slowly   Complete by:  As directed      Allergies as of 05/11/2017   No Known Allergies     Medication List    STOP taking these medications   aspirin 81 MG tablet   HYDROcodone-acetaminophen 5-325 MG tablet Commonly known as:  NORCO/VICODIN   ibuprofen 200 MG tablet Commonly known as:  ADVIL,MOTRIN   IBUPROFEN PM 200-25 MG Caps Generic drug:  Ibuprofen-Diphenhydramine HCl   losartan-hydrochlorothiazide 100-25 MG tablet Commonly known as:  HYZAAR   metFORMIN 500 MG tablet Commonly known as:  GLUCOPHAGE     TAKE these medications   amLODipine 10 MG tablet Commonly known as:  NORVASC Take 10 mg by mouth daily.   atorvastatin 20 MG tablet Commonly known as:  LIPITOR Take 20 mg by mouth daily.   b complex vitamins tablet Take 1 tablet by mouth daily.   CALCIUM-MAGNESIUM-ZINC PO Take 1 tablet daily by mouth.   doxepin 10 MG capsule Commonly known as:  SINEQUAN Take 10 mg by mouth at bedtime.   doxycycline 100 MG tablet Commonly known as:  VIBRA-TABS Take 1 tablet (100 mg total) 2 (two) times daily by mouth.   ferrous sulfate 325 (65 FE) MG EC tablet Take 325 mg daily with breakfast by mouth.   gabapentin 100 MG capsule Commonly known as:  NEURONTIN Take 100 mg at bedtime by mouth.   levothyroxine 175 MCG tablet Commonly known as:  SYNTHROID, LEVOTHROID Take 175 mcg by mouth daily before breakfast.   lipase/protease/amylase 96045 units Cpep capsule Commonly known as:  CREON Take 36,000 Units by mouth 3 (three) times daily before meals.   Melatonin 10 MG Tabs Take 10 mg by mouth at bedtime.   metoprolol succinate 25 MG 24 hr tablet Commonly known as:  TOPROL-XL Take 25 mg by mouth daily.   MUSCLE RUB EX Apply 1 application at bedtime topically. Apply to legs   oxybutynin 10 MG 24 hr tablet Commonly known as:  DITROPAN-XL Take 1 tablet (10 mg total) at bedtime by mouth.   ranitidine 150 MG tablet Commonly known as:  ZANTAC Take 150  mg by mouth at bedtime.   sertraline 100 MG tablet Commonly known as:  ZOLOFT Take 100 mg every morning by mouth.   sitaGLIPtin 100 MG tablet Commonly known as:  JANUVIA Take 100 mg at bedtime by mouth.   sulfaSALAzine 500 MG tablet Commonly known as:  AZULFIDINE Take 1,000 mg by mouth 2 (two) times daily.   vitamin B-12 1000 MCG tablet Commonly known as:  CYANOCOBALAMIN Take 1,000 mcg daily by mouth.   Vitamin D3 1000 units Caps Take 2,000 Units by mouth daily.   warfarin 5 MG tablet Commonly known as:  COUMADIN Take 5 mg daily at 6 PM by mouth.   warfarin 1 MG tablet Commonly known as:  COUMADIN Take 1 mg every Monday, Wednesday, and Friday by mouth. Pt takes with the 5mg  to get a total of 6mg  on Mon,Wed,Fri only.      Follow-up Information    Call Ihor Gully, MD.   Specialty:  Urology Why:  For an appointment in 1-2 weeks when you get home. Contact information: [address] [place] [address] [phone]  Corrington, Kip A, MD. Schedule an appointment as soon as possible for a visit in 1 week(s).   Specialty:  Family Medicine Why:  with repeat CBC/BMP. Follow up repeat INR at earliest convenience Contact information: [address] [place] [address] [phone]          No Known Allergies  Consultations:  Urology   Procedures/Studies: Ct Renal Stone Study  Result Date: 05/09/2017 CLINICAL DATA:  Acute onset of right back pain. Gross hematuria. Large amount of white blood cells and white blood cells in the urine. EXAM: CT ABDOMEN AND PELVIS WITHOUT CONTRAST TECHNIQUE: Multidetector CT imaging of the abdomen and pelvis was performed following the standard protocol without IV contrast. COMPARISON:  CT of the chest, abdomen and pelvis performed 02/21/2017 FINDINGS: Lower chest: Mild bibasilar scarring is noted. Scattered coronary artery calcifications are seen. Hepatobiliary: The liver is unremarkable in appearance. The  gallbladder is unremarkable in appearance. The common bile duct remains normal in caliber. Pancreas: The pancreas is within normal limits. Spleen: The spleen is unremarkable in appearance. Adrenals/Urinary Tract: The adrenal glands are unremarkable in appearance. Minimal left-sided hydronephrosis is noted, without evidence of distal obstruction. Nonspecific perinephric stranding is noted bilaterally. No renal or ureteral stones are identified. Stomach/Bowel: The stomach is unremarkable in appearance. The small bowel is within normal limits. The appendix is not visualized; there is no evidence for appendicitis. The colon is unremarkable in appearance. Vascular/Lymphatic: Diffuse calcification is seen along the abdominal aorta and its branches. The abdominal aorta is otherwise grossly unremarkable. The inferior vena cava is grossly unremarkable. No retroperitoneal lymphadenopathy is seen. No pelvic sidewall lymphadenopathy is identified. Reproductive: Marked wall thickening is noted along the bladder, more prominent anteriorly. This may reflect severe cystitis. Underlying mass cannot be excluded. The prostate remains normal in size. A Foley catheter is noted in expected position. Other: No additional soft tissue abnormalities are seen. Musculoskeletal: No acute osseous abnormalities are identified. Lumbar spinal fusion is noted at T12-L2. Multilevel vacuum phenomenon is noted along the lower thoracic and lumbar spine. The visualized musculature is unremarkable in appearance. IMPRESSION: 1. Marked bladder wall thickening, more prominent anteriorly. This may reflect severe cystitis. Underlying mass cannot be excluded. Would correlate with the patient's lab findings, and consider cystoscopy if deemed clinically appropriate. 2. Minimal left-sided hydronephrosis, without evidence of distal obstruction. 3. Scattered coronary artery calcifications seen. 4. Degenerative change along the lower thoracic and lumbar spine. Aortic  Atherosclerosis (ICD10-I70.0). Electronically Signed   By: Roanna Raider M.D.   On: 05/09/2017 02:21     Subjective: Patient seen and examined at bedside.  He feels much better and wants to go home.  He denies overnight fever, nausea or vomiting.  Discharge Exam: Vitals:   05/10/17 2056 05/11/17 0450  BP: 140/65 (!) 161/76  Pulse: 65 76  Resp: 20 20  Temp: 99 F (37.2 C) 98.9 F (37.2 C)  SpO2: 98% 97%   Vitals:   05/10/17 0433 05/10/17 1304 05/10/17 2056 05/11/17 0450  BP: (!) 130/58 (!) 128/42 140/65 (!) 161/76  Pulse: 69 66 65 76  Resp: 20 20 20 20   Temp: 98.9 F (37.2 C) 98.5 F (36.9 C) 99 F (37.2 C) 98.9 F (37.2 C)  TempSrc: Oral Oral Oral Oral  SpO2: 94% 95% 98% 97%  Weight: 74.7 kg (164 lb 9.6 oz)   74.5 kg (164 lb 3.9 oz)  Height:        General: Pt is alert, awake, not in acute  distress Cardiovascular: Rate controlled, S1/S2 + Respiratory: Bilateral decreased breath sounds at bases Abdominal: Soft, NT, ND, bowel sounds + Extremities: no edema, no cyanosis    The results of significant diagnostics from this hospitalization (including imaging, microbiology, ancillary and laboratory) are listed below for reference.     Microbiology: Recent Results (from the past 240 hour(s))  Urine culture     Status: Abnormal   Collection Time: 05/09/17 12:45 AM  Result Value Ref Range Status   Specimen Description URINE, CATHETERIZED  Final   Special Requests NONE  Final   Culture (A)  Final    >=100,000 COLONIES/mL METHICILLIN RESISTANT STAPHYLOCOCCUS AUREUS   Report Status 05/11/2017 FINAL  Final   Organism ID, Bacteria METHICILLIN RESISTANT STAPHYLOCOCCUS AUREUS (A)  Final      Susceptibility   Methicillin resistant staphylococcus aureus - MIC*    CIPROFLOXACIN >=8 RESISTANT Resistant     GENTAMICIN <=0.5 SENSITIVE Sensitive     NITROFURANTOIN <=16 SENSITIVE Sensitive     OXACILLIN >=4 RESISTANT Resistant     TETRACYCLINE <=1 SENSITIVE Sensitive      VANCOMYCIN <=0.5 SENSITIVE Sensitive     TRIMETH/SULFA >=320 RESISTANT Resistant     CLINDAMYCIN <=0.25 SENSITIVE Sensitive     RIFAMPIN <=0.5 SENSITIVE Sensitive     Inducible Clindamycin NEGATIVE Sensitive     * >=100,000 COLONIES/mL METHICILLIN RESISTANT STAPHYLOCOCCUS AUREUS     Labs: BNP (last 3 results) No results for input(s): BNP in the last 8760 hours. Basic Metabolic Panel: Recent Labs  Lab 05/09/17 0059 05/09/17 0716 05/09/17 1355 05/10/17 0512 05/11/17 0523  NA 132* 132* 130* 136 135  K 3.9 3.8 3.5 3.7 3.2*  CL 97* 99* 97* 102 99*  CO2  --  24 22 26 29   GLUCOSE 204* 195* 359* 177* 182*  BUN 57* 58* 50* 39* 26*  CREATININE 3.00* 2.35* 2.03* 1.49* 1.32*  CALCIUM  --  8.2* 8.1* 8.4* 8.7*  MG  --   --  1.6* 2.1 1.6*   Liver Function Tests: No results for input(s): AST, ALT, ALKPHOS, BILITOT, PROT, ALBUMIN in the last 168 hours. No results for input(s): LIPASE, AMYLASE in the last 168 hours. No results for input(s): AMMONIA in the last 168 hours. CBC: Recent Labs  Lab 05/09/17 0057  05/09/17 0440 05/09/17 0716 05/09/17 1111 05/10/17 0512 05/11/17 0523  WBC 9.0  --  9.2 7.7 6.2 8.0 12.9*  NEUTROABS 6.0  --   --   --   --  5.1 9.6*  HGB 7.7*   < > 7.8* 7.8* 7.6* 7.4* 8.3*  HCT 23.2*   < > 23.9* 23.2* 22.6* 22.1* 25.3*  MCV 94.3  --  94.5 93.9 93.8 95.7 96.6  PLT 267  --  263 239 238 247 253   < > = values in this interval not displayed.   Cardiac Enzymes: No results for input(s): CKTOTAL, CKMB, CKMBINDEX, TROPONINI in the last 168 hours. BNP: Invalid input(s): POCBNP CBG: Recent Labs  Lab 05/10/17 0738 05/10/17 1136 05/10/17 1637 05/10/17 2102 05/11/17 0733  GLUCAP 169* 222* 199* 304* 205*   D-Dimer No results for input(s): DDIMER in the last 72 hours. Hgb A1c Recent Labs    05/09/17 1111  HGBA1C 6.4*   Lipid Profile No results for input(s): CHOL, HDL, LDLCALC, TRIG, CHOLHDL, LDLDIRECT in the last 72 hours. Thyroid function studies No  results for input(s): TSH, T4TOTAL, T3FREE, THYROIDAB in the last 72 hours.  Invalid input(s): FREET3 Anemia work up No results  for input(s): VITAMINB12, FOLATE, FERRITIN, TIBC, IRON, RETICCTPCT in the last 72 hours. Urinalysis    Component Value Date/Time   COLORURINE AMBER (A) 05/08/2017 2110   APPEARANCEUR TURBID (A) 05/08/2017 2110   LABSPEC 1.014 05/08/2017 2110   PHURINE 5.0 05/08/2017 2110   GLUCOSEU 50 (A) 05/08/2017 2110   HGBUR LARGE (A) 05/08/2017 2110   BILIRUBINUR NEGATIVE 05/08/2017 2110   KETONESUR NEGATIVE 05/08/2017 2110   PROTEINUR 100 (A) 05/08/2017 2110   NITRITE POSITIVE (A) 05/08/2017 2110   LEUKOCYTESUR LARGE (A) 05/08/2017 2110   Sepsis Labs Invalid input(s): PROCALCITONIN,  WBC,  LACTICIDVEN Microbiology Recent Results (from the past 240 hour(s))  Urine culture     Status: Abnormal   Collection Time: 05/09/17 12:45 AM  Result Value Ref Range Status   Specimen Description URINE, CATHETERIZED  Final   Special Requests NONE  Final   Culture (A)  Final    >=100,000 COLONIES/mL METHICILLIN RESISTANT STAPHYLOCOCCUS AUREUS   Report Status 05/11/2017 FINAL  Final   Organism ID, Bacteria METHICILLIN RESISTANT STAPHYLOCOCCUS AUREUS (A)  Final      Susceptibility   Methicillin resistant staphylococcus aureus - MIC*    CIPROFLOXACIN >=8 RESISTANT Resistant     GENTAMICIN <=0.5 SENSITIVE Sensitive     NITROFURANTOIN <=16 SENSITIVE Sensitive     OXACILLIN >=4 RESISTANT Resistant     TETRACYCLINE <=1 SENSITIVE Sensitive     VANCOMYCIN <=0.5 SENSITIVE Sensitive     TRIMETH/SULFA >=320 RESISTANT Resistant     CLINDAMYCIN <=0.25 SENSITIVE Sensitive     RIFAMPIN <=0.5 SENSITIVE Sensitive     Inducible Clindamycin NEGATIVE Sensitive     * >=100,000 COLONIES/mL METHICILLIN RESISTANT STAPHYLOCOCCUS AUREUS     Time coordinating discharge: 35 minutes  SIGNED:   Glade LloydKshitiz Lebron Nauert, MD  [facility] 05/11/2017, 9:56 AM Pager: [phone]  If 7PM-7AM, please  contact night-coverage [email or web address] Password [email or web address]

## 2017-05-11 NOTE — Care Management Note (Addendum)
Case Management Note  Patient Details  Name: Justin Salazar MRN: [id number] Date of Birth: 12/10/1926  Subjective/Objective:    Acute UTI,  AKI, HTN, DM, FTT               Action/Plan: Discharge Planning: NCM spoke to pt and states he can recall name of HH agency. He has HH RN coming to do dressing changes on his sacral wounds. Pt has follow up with wound care MD on Tuesday at 1 pm. Pt lives at home with son. Has RW at home. Will give pt a call back once he gets home to retrieve number. [facility]. [facility] but found [facility] was not helpful in the past.   Received call from [facility] rep, Katrina and [facility] Made aware that HHPT was added to orders.   PCP Delano MetzORRINGTON, KIP A MD  Expected Discharge Date:  05/11/17               Expected Discharge Plan:  Home w Home Health Services  In-House Referral:  NA  Discharge planning Services  CM Consult  Post Acute Care Choice:  Home Health, Resumption of Svcs/PTA Provider Choice offered to:  Patient  DME Arranged:  N/A DME Agency:  NA  HH Arranged:  RN, PT HH Agency:  Other - See comment  Status of Service:  Completed, signed off  If discussed at Long Length of Stay Meetings, dates discussed:    Additional Comments:  Elliot CousinShavis, Saloma Cadena Ellen, RN 05/11/2017, 10:55 AM

## 2017-06-04 ENCOUNTER — Other Ambulatory Visit: Payer: Medicare Other

## 2017-06-13 ENCOUNTER — Other Ambulatory Visit: Payer: Medicare Other

## 2017-06-30 ENCOUNTER — Other Ambulatory Visit: Payer: Medicare Other

## 2017-07-08 ENCOUNTER — Other Ambulatory Visit: Payer: Medicare Other

## 2017-07-26 ENCOUNTER — Encounter (HOSPITAL_COMMUNITY): Payer: Self-pay | Admitting: Emergency Medicine

## 2017-07-26 ENCOUNTER — Emergency Department (HOSPITAL_COMMUNITY): Payer: Medicare Other

## 2017-07-26 ENCOUNTER — Other Ambulatory Visit: Payer: Self-pay

## 2017-07-26 ENCOUNTER — Inpatient Hospital Stay (HOSPITAL_COMMUNITY)
Admission: EM | Admit: 2017-07-26 | Discharge: 2017-08-01 | DRG: 643 | Disposition: A | Discharge status: Hospice | Payer: Medicare Other | Attending: Internal Medicine | Admitting: Internal Medicine

## 2017-07-26 DIAGNOSIS — E119 Type 2 diabetes mellitus without complications: Secondary | ICD-10-CM

## 2017-07-26 DIAGNOSIS — I2510 Atherosclerotic heart disease of native coronary artery without angina pectoris: Secondary | ICD-10-CM | POA: Diagnosis present

## 2017-07-26 DIAGNOSIS — J189 Pneumonia, unspecified organism: Secondary | ICD-10-CM | POA: Diagnosis not present

## 2017-07-26 DIAGNOSIS — E118 Type 2 diabetes mellitus with unspecified complications: Secondary | ICD-10-CM | POA: Diagnosis not present

## 2017-07-26 DIAGNOSIS — Z79890 Hormone replacement therapy (postmenopausal): Secondary | ICD-10-CM | POA: Diagnosis not present

## 2017-07-26 DIAGNOSIS — K51919 Ulcerative colitis, unspecified with unspecified complications: Secondary | ICD-10-CM | POA: Diagnosis not present

## 2017-07-26 DIAGNOSIS — Z66 Do not resuscitate: Secondary | ICD-10-CM | POA: Diagnosis present

## 2017-07-26 DIAGNOSIS — D649 Anemia, unspecified: Secondary | ICD-10-CM | POA: Diagnosis present

## 2017-07-26 DIAGNOSIS — R531 Weakness: Secondary | ICD-10-CM | POA: Diagnosis not present

## 2017-07-26 DIAGNOSIS — E039 Hypothyroidism, unspecified: Secondary | ICD-10-CM | POA: Diagnosis present

## 2017-07-26 DIAGNOSIS — Z8249 Family history of ischemic heart disease and other diseases of the circulatory system: Secondary | ICD-10-CM | POA: Diagnosis not present

## 2017-07-26 DIAGNOSIS — K219 Gastro-esophageal reflux disease without esophagitis: Secondary | ICD-10-CM | POA: Diagnosis present

## 2017-07-26 DIAGNOSIS — T502X5A Adverse effect of carbonic-anhydrase inhibitors, benzothiadiazides and other diuretics, initial encounter: Secondary | ICD-10-CM | POA: Diagnosis present

## 2017-07-26 DIAGNOSIS — T68XXXA Hypothermia, initial encounter: Secondary | ICD-10-CM | POA: Diagnosis present

## 2017-07-26 DIAGNOSIS — R680 Hypothermia, not associated with low environmental temperature: Secondary | ICD-10-CM | POA: Diagnosis present

## 2017-07-26 DIAGNOSIS — K5190 Ulcerative colitis, unspecified, without complications: Secondary | ICD-10-CM | POA: Diagnosis present

## 2017-07-26 DIAGNOSIS — I482 Chronic atrial fibrillation: Secondary | ICD-10-CM | POA: Diagnosis present

## 2017-07-26 DIAGNOSIS — Z8679 Personal history of other diseases of the circulatory system: Secondary | ICD-10-CM | POA: Diagnosis not present

## 2017-07-26 DIAGNOSIS — E222 Syndrome of inappropriate secretion of antidiuretic hormone: Secondary | ICD-10-CM | POA: Diagnosis not present

## 2017-07-26 DIAGNOSIS — Z9181 History of falling: Secondary | ICD-10-CM | POA: Diagnosis not present

## 2017-07-26 DIAGNOSIS — M8448XA Pathological fracture, other site, initial encounter for fracture: Secondary | ICD-10-CM | POA: Diagnosis present

## 2017-07-26 DIAGNOSIS — I10 Essential (primary) hypertension: Secondary | ICD-10-CM | POA: Diagnosis present

## 2017-07-26 DIAGNOSIS — I959 Hypotension, unspecified: Secondary | ICD-10-CM | POA: Diagnosis present

## 2017-07-26 DIAGNOSIS — Z7984 Long term (current) use of oral hypoglycemic drugs: Secondary | ICD-10-CM

## 2017-07-26 DIAGNOSIS — Y95 Nosocomial condition: Secondary | ICD-10-CM | POA: Diagnosis present

## 2017-07-26 DIAGNOSIS — J181 Lobar pneumonia, unspecified organism: Secondary | ICD-10-CM | POA: Diagnosis not present

## 2017-07-26 DIAGNOSIS — R627 Adult failure to thrive: Secondary | ICD-10-CM | POA: Diagnosis not present

## 2017-07-26 DIAGNOSIS — E871 Hypo-osmolality and hyponatremia: Secondary | ICD-10-CM | POA: Diagnosis not present

## 2017-07-26 DIAGNOSIS — T68XXXD Hypothermia, subsequent encounter: Secondary | ICD-10-CM | POA: Diagnosis not present

## 2017-07-26 DIAGNOSIS — E860 Dehydration: Secondary | ICD-10-CM | POA: Diagnosis present

## 2017-07-26 DIAGNOSIS — Z515 Encounter for palliative care: Secondary | ICD-10-CM | POA: Diagnosis present

## 2017-07-26 LAB — COMPREHENSIVE METABOLIC PANEL
ALK PHOS: 172 U/L — AB (ref 38–126)
ALT: 15 U/L — AB (ref 17–63)
ANION GAP: 13 (ref 5–15)
AST: 27 U/L (ref 15–41)
Albumin: 3.1 g/dL — ABNORMAL LOW (ref 3.5–5.0)
BUN: 22 mg/dL — ABNORMAL HIGH (ref 6–20)
CALCIUM: 8.6 mg/dL — AB (ref 8.9–10.3)
CO2: 21 mmol/L — ABNORMAL LOW (ref 22–32)
CREATININE: 1.05 mg/dL (ref 0.61–1.24)
Chloride: 87 mmol/L — ABNORMAL LOW (ref 101–111)
Glucose, Bld: 131 mg/dL — ABNORMAL HIGH (ref 65–99)
Potassium: 4.8 mmol/L (ref 3.5–5.1)
Sodium: 121 mmol/L — ABNORMAL LOW (ref 135–145)
Total Bilirubin: 0.6 mg/dL (ref 0.3–1.2)
Total Protein: 6.4 g/dL — ABNORMAL LOW (ref 6.5–8.1)

## 2017-07-26 LAB — TROPONIN I

## 2017-07-26 LAB — CBC WITH DIFFERENTIAL/PLATELET
BASOS ABS: 0 10*3/uL (ref 0.0–0.1)
Basophils Relative: 0 %
EOS PCT: 4 %
Eosinophils Absolute: 0.2 10*3/uL (ref 0.0–0.7)
HEMATOCRIT: 29.7 % — AB (ref 39.0–52.0)
HEMOGLOBIN: 9.7 g/dL — AB (ref 13.0–17.0)
LYMPHS ABS: 1.1 10*3/uL (ref 0.7–4.0)
Lymphocytes Relative: 17 %
MCH: 27.2 pg (ref 26.0–34.0)
MCHC: 32.7 g/dL (ref 30.0–36.0)
MCV: 83.4 fL (ref 78.0–100.0)
Monocytes Absolute: 0.5 10*3/uL (ref 0.1–1.0)
Monocytes Relative: 8 %
NEUTROS ABS: 4.6 10*3/uL (ref 1.7–7.7)
NEUTROS PCT: 71 %
PLATELETS: 318 10*3/uL (ref 150–400)
RBC: 3.56 MIL/uL — AB (ref 4.22–5.81)
RDW: 15.6 % — ABNORMAL HIGH (ref 11.5–15.5)
WBC: 6.5 10*3/uL (ref 4.0–10.5)

## 2017-07-26 LAB — URINALYSIS, ROUTINE W REFLEX MICROSCOPIC
Bilirubin Urine: NEGATIVE
Glucose, UA: NEGATIVE mg/dL
Ketones, ur: NEGATIVE mg/dL
NITRITE: NEGATIVE
PH: 5 (ref 5.0–8.0)
Protein, ur: 30 mg/dL — AB
SPECIFIC GRAVITY, URINE: 1.014 (ref 1.005–1.030)

## 2017-07-26 LAB — TSH: TSH: 18.218 u[IU]/mL — ABNORMAL HIGH (ref 0.350–4.500)

## 2017-07-26 LAB — BASIC METABOLIC PANEL
Anion gap: 13 (ref 5–15)
BUN: 21 mg/dL — AB (ref 6–20)
CALCIUM: 8.5 mg/dL — AB (ref 8.9–10.3)
CO2: 21 mmol/L — ABNORMAL LOW (ref 22–32)
CREATININE: 1.05 mg/dL (ref 0.61–1.24)
Chloride: 87 mmol/L — ABNORMAL LOW (ref 101–111)
GFR calc Af Amer: >60 mL/min (ref >60)
Glucose, Bld: 123 mg/dL — ABNORMAL HIGH (ref 65–99)
POTASSIUM: 4.8 mmol/L (ref 3.5–5.1)
SODIUM: 121 mmol/L — AB (ref 135–145)

## 2017-07-26 LAB — GLUCOSE, CAPILLARY: Glucose-Capillary: 121 mg/dL — ABNORMAL HIGH (ref 65–99)

## 2017-07-26 MED ORDER — BISACODYL 5 MG PO TBEC
5.0000 mg | DELAYED_RELEASE_TABLET | Freq: Every day | ORAL | Status: DC | PRN
  Administered 2017-07-29: 5 mg via ORAL
  Filled 2017-07-26: qty 1

## 2017-07-26 MED ORDER — SODIUM CHLORIDE 0.9 % IV BOLUS (SEPSIS)
500.0000 mL | Freq: Once | INTRAVENOUS | Status: AC
  Administered 2017-07-26: 500 mL via INTRAVENOUS

## 2017-07-26 MED ORDER — FAMOTIDINE 20 MG PO TABS
20.0000 mg | ORAL_TABLET | Freq: Two times a day (BID) | ORAL | Status: DC
  Administered 2017-07-26 – 2017-08-01 (×12): 20 mg via ORAL
  Filled 2017-07-26 (×12): qty 1

## 2017-07-26 MED ORDER — LEVOTHYROXINE SODIUM 75 MCG PO TABS
225.0000 ug | ORAL_TABLET | Freq: Every day | ORAL | Status: DC
  Administered 2017-07-27 – 2017-08-01 (×6): 225 ug via ORAL
  Filled 2017-07-26 (×6): qty 3

## 2017-07-26 MED ORDER — SODIUM CHLORIDE 0.9 % IV SOLN
INTRAVENOUS | Status: DC
  Administered 2017-07-26 – 2017-07-27 (×2): via INTRAVENOUS

## 2017-07-26 MED ORDER — INSULIN ASPART 100 UNIT/ML ~~LOC~~ SOLN
0.0000 [IU] | Freq: Three times a day (TID) | SUBCUTANEOUS | Status: DC
  Administered 2017-07-27: 2 [IU] via SUBCUTANEOUS
  Administered 2017-07-28 – 2017-07-29 (×3): 3 [IU] via SUBCUTANEOUS
  Administered 2017-07-29: 2 [IU] via SUBCUTANEOUS
  Administered 2017-07-29: 3 [IU] via SUBCUTANEOUS
  Administered 2017-07-30: 5 [IU] via SUBCUTANEOUS
  Administered 2017-07-30: 2 [IU] via SUBCUTANEOUS
  Administered 2017-07-30 – 2017-07-31 (×3): 3 [IU] via SUBCUTANEOUS
  Administered 2017-08-01: 5 [IU] via SUBCUTANEOUS
  Administered 2017-08-01: 2 [IU] via SUBCUTANEOUS

## 2017-07-26 MED ORDER — SULFASALAZINE 500 MG PO TABS
1000.0000 mg | ORAL_TABLET | Freq: Two times a day (BID) | ORAL | Status: DC
  Administered 2017-07-27 – 2017-08-01 (×11): 1000 mg via ORAL
  Filled 2017-07-26 (×18): qty 2

## 2017-07-26 MED ORDER — PIPERACILLIN-TAZOBACTAM 3.375 G IVPB 30 MIN
3.3750 g | Freq: Once | INTRAVENOUS | Status: AC
Start: 2017-07-26 — End: 2017-07-26
  Administered 2017-07-26: 3.375 g via INTRAVENOUS
  Filled 2017-07-26: qty 50

## 2017-07-26 MED ORDER — METFORMIN HCL 500 MG PO TABS: 1000.0000 mg | ORAL_TABLET | Freq: Two times a day (BID) | ORAL | Status: DC

## 2017-07-26 MED ORDER — TAMSULOSIN HCL 0.4 MG PO CAPS
0.4000 mg | ORAL_CAPSULE | Freq: Every evening | ORAL | Status: DC
  Administered 2017-07-27 – 2017-07-31 (×5): 0.4 mg via ORAL
  Filled 2017-07-26 (×4): qty 1

## 2017-07-26 MED ORDER — DEXTROSE 5 % IV SOLN
500.0000 mg | INTRAVENOUS | Status: DC
  Administered 2017-07-26: 500 mg via INTRAVENOUS
  Filled 2017-07-26 (×3): qty 500

## 2017-07-26 MED ORDER — INSULIN ASPART 100 UNIT/ML ~~LOC~~ SOLN: 0.0000 [IU] | Freq: Every day | SUBCUTANEOUS | Status: DC

## 2017-07-26 MED ORDER — GABAPENTIN 100 MG PO CAPS
100.0000 mg | ORAL_CAPSULE | Freq: Three times a day (TID) | ORAL | Status: DC
  Administered 2017-07-27 – 2017-08-01 (×17): 100 mg via ORAL
  Filled 2017-07-26 (×17): qty 1

## 2017-07-26 MED ORDER — LINAGLIPTIN 5 MG PO TABS
5.0000 mg | ORAL_TABLET | Freq: Every day | ORAL | Status: DC
  Administered 2017-07-27: 5 mg via ORAL
  Filled 2017-07-26: qty 1

## 2017-07-26 MED ORDER — DEXTROSE 5 % IV SOLN
1.0000 g | INTRAVENOUS | Status: DC
  Administered 2017-07-26: 1 g via INTRAVENOUS
  Filled 2017-07-26 (×3): qty 10

## 2017-07-26 MED ORDER — ATORVASTATIN CALCIUM 20 MG PO TABS
20.0000 mg | ORAL_TABLET | Freq: Every morning | ORAL | Status: DC
  Administered 2017-07-27 – 2017-08-01 (×6): 20 mg via ORAL
  Filled 2017-07-26 (×6): qty 1

## 2017-07-26 MED ORDER — ENOXAPARIN SODIUM 40 MG/0.4ML ~~LOC~~ SOLN
40.0000 mg | SUBCUTANEOUS | Status: DC
  Administered 2017-07-27 – 2017-07-31 (×6): 40 mg via SUBCUTANEOUS
  Filled 2017-07-26 (×6): qty 0.4

## 2017-07-26 MED ORDER — SERTRALINE HCL 50 MG PO TABS
100.0000 mg | ORAL_TABLET | ORAL | Status: DC
  Administered 2017-07-27 – 2017-08-01 (×6): 100 mg via ORAL
  Filled 2017-07-26 (×6): qty 2

## 2017-07-26 MED ORDER — DOXEPIN HCL 10 MG PO CAPS
10.0000 mg | ORAL_CAPSULE | Freq: Every day | ORAL | Status: DC
  Administered 2017-07-26 – 2017-07-31 (×6): 10 mg via ORAL
  Filled 2017-07-26 (×10): qty 1

## 2017-07-26 MED ORDER — PANCRELIPASE (LIP-PROT-AMYL) 12000-38000 UNITS PO CPEP
36000.0000 [IU] | ORAL_CAPSULE | Freq: Three times a day (TID) | ORAL | Status: DC
  Administered 2017-07-27 – 2017-08-01 (×16): 36000 [IU] via ORAL
  Filled 2017-07-26 (×3): qty 1
  Filled 2017-07-26: qty 3
  Filled 2017-07-26 (×3): qty 1
  Filled 2017-07-26 (×5): qty 3
  Filled 2017-07-26: qty 1
  Filled 2017-07-26 (×2): qty 3
  Filled 2017-07-26: qty 1

## 2017-07-26 NOTE — ED Provider Notes (Signed)
[facility] EMERGENCY DEPARTMENT Provider Note   CSN: [id number] Arrival date & time: 07/26/17  1815     History   Chief Complaint Chief Complaint  Patient presents with  . Altered Mental Status    HPI Justin Salazar is a 84 y.o. male.  HPI Patient brought in for generalized weakness and feeling worse.  Had recent admission to outside hospital for urinary tract infection.  Appear to be candidal infection.  Also has had recent falls and hit his head.  Has known C3 cervical spine fracture.  Previously on Coumadin but stopped due to being on antibiotics and with the recent falls.  Has not had falls since his last CT scan however.  Has had decreased oral intake and more confusion.  Has reportedly been hallucinating and seeing things around the house that are not there.  Has a chronic Foley catheter in.  Has had occasional cough also. Past Medical History:  Diagnosis Date  . Coronary artery disease   . Diabetes mellitus without complication (HCC)   . GERD (gastroesophageal reflux disease)   . Hypertension   . Thyroid disease     Patient Active Problem List   Diagnosis Date Noted  . Acute lower UTI 05/09/2017  . Acute urinary retention 05/09/2017  . Chronic anticoagulation 05/09/2017  . Acute renal failure (ARF) (HCC) 05/09/2017  . Acute blood loss anemia 05/09/2017  . Diabetes mellitus type 2, controlled (HCC) 05/09/2017  . Acute GI bleeding 05/09/2017  . Hypothyroidism 05/09/2017  . Essential hypertension 05/09/2017  . Acute UTI 05/09/2017  . Hematuria 05/09/2017    Past Surgical History:  Procedure Laterality Date  . NASAL SINUS SURGERY    . ORIF NASAL FRACTURE N/A 03/13/2017   Procedure: OPEN REDUCTION INTERNAL FIXATION (ORIF) NASAL FRACTURE WITH INTERAL AND EXTERNAL SPLINTING;  Surgeon: Peggye Form, DO;  Location: [facility];  Service: Plastics;  Laterality: N/A;  . SHOULDER SURGERY    . TONSILLECTOMY         Home Medications    Prior  to Admission medications   Medication Sig Start Date End Date Taking? Authorizing Provider  amLODipine (NORVASC) 10 MG tablet Take 10 mg by mouth daily.    [provider]  atorvastatin (LIPITOR) 20 MG tablet Take 20 mg by mouth daily. 02/14/17   [provider]  b complex vitamins tablet Take 1 tablet by mouth daily.    [provider]  CALCIUM-MAGNESIUM-ZINC PO Take 1 tablet daily by mouth.    [provider]  Cholecalciferol (VITAMIN D3) 1000 units CAPS Take 2,000 Units by mouth daily.     [provider]  doxepin (SINEQUAN) 10 MG capsule Take 10 mg by mouth at bedtime.     [provider]  doxycycline (VIBRA-TABS) 100 MG tablet Take 1 tablet (100 mg total) 2 (two) times daily by mouth. 05/11/17   Glade Lloyd, MD  ferrous sulfate 325 (65 FE) MG EC tablet Take 325 mg daily with breakfast by mouth.    [provider]  gabapentin (NEURONTIN) 100 MG capsule Take 100 mg at bedtime by mouth.  01/28/17   [provider]  levothyroxine (SYNTHROID, LEVOTHROID) 175 MCG tablet Take 175 mcg by mouth daily before breakfast.    [provider]  lipase/protease/amylase (CREON) 12000 UNITS CPEP capsule Take 36,000 Units by mouth 3 (three) times daily before meals.     [provider]  Melatonin 10 MG TABS Take 10 mg by mouth at bedtime.  [provider]  Menthol-Methyl Salicylate (MUSCLE RUB EX) Apply 1 application at bedtime topically. Apply to legs    [provider]  metoprolol succinate (TOPROL-XL) 25 MG 24 hr tablet Take 25 mg by mouth daily. 11/26/16   [provider]  oxybutynin (DITROPAN-XL) 10 MG 24 hr tablet Take 1 tablet (10 mg total) at bedtime by mouth. 05/11/17   Glade Lloyd, MD  ranitidine (ZANTAC) 150 MG tablet Take 150 mg by mouth at bedtime.     [provider]  sertraline (ZOLOFT) 100 MG tablet Take 100 mg every morning by mouth.     [provider]    sitaGLIPtin (JANUVIA) 100 MG tablet Take 100 mg at bedtime by mouth.  02/03/17 02/03/18  [provider]  sulfaSALAzine (AZULFIDINE) 500 MG tablet Take 1,000 mg by mouth 2 (two) times daily.     [provider]  vitamin B-12 (CYANOCOBALAMIN) 1000 MCG tablet Take 1,000 mcg daily by mouth.    [provider]  warfarin (COUMADIN) 1 MG tablet Take 1 mg every Monday, Wednesday, and Friday by mouth. Pt takes with the 5mg  to get a total of 6mg  on Mon,Wed,Fri only.    [provider]  warfarin (COUMADIN) 5 MG tablet Take 5 mg daily at 6 PM by mouth.     [provider]    Family History Family History  Problem Relation Age of Onset  . Hypertension Other     Social History Social History   Tobacco Use  . Smoking status: Never Smoker  . Smokeless tobacco: Never Used  Substance Use Topics  . Alcohol use: Yes    Comment: rarely  . Drug use: No     Allergies   Patient has no known allergies.   Review of Systems Review of Systems  Constitutional: Positive for appetite change and fatigue. Negative for fever.  HENT: Negative for congestion.   Respiratory: Positive for cough. Negative for shortness of breath.   Cardiovascular: Negative for chest pain.  Gastrointestinal: Negative for abdominal pain.  Genitourinary: Negative for flank pain.       Foley catheter in place  Musculoskeletal: Negative for back pain.  Skin: Negative for rash.  Neurological: Negative for numbness.  Hematological: Negative for adenopathy.  Psychiatric/Behavioral: Positive for confusion.     Physical Exam Updated Vital Signs BP (!) 103/46   Pulse 93   Temp (!) 97.5 F (36.4 C) (Oral)   Resp 15   Ht 5\' 9"  (1.753 m)   Wt 74.8 kg (165 lb)   SpO2 91%   BMI 24.37 kg/m   Physical Exam  Constitutional: He is oriented to person, place, and time. He appears well-developed.  HENT:  Head: Normocephalic.  Mucous membranes are dry  Eyes: Pupils are equal, round,  and reactive to light.  Neck: Neck supple.  Cardiovascular: Normal rate.  Pulmonary/Chest: He has no wheezes. He has no rales.  Abdominal: Soft. There is no tenderness.  Musculoskeletal: He exhibits no edema.  Neurological: He is alert and oriented to person, place, and time.  Skin: Skin is warm. Capillary refill takes less than 2 seconds.  Psychiatric: He has a normal mood and affect.  Patient has a cervical collar in place our scabs on top of head from previous fall.  ED Treatments / Results  Labs (all labs ordered are listed, but only abnormal results are displayed) Labs Reviewed  COMPREHENSIVE METABOLIC PANEL - Abnormal; Notable for the following components:      Result  Value   Sodium 121 (*)    Chloride 87 (*)    CO2 21 (*)    Glucose, Bld 131 (*)    BUN 22 (*)    Calcium 8.6 (*)    Total Protein 6.4 (*)    Albumin 3.1 (*)    ALT 15 (*)    Alkaline Phosphatase 172 (*)    All other components within normal limits  URINALYSIS, ROUTINE W REFLEX MICROSCOPIC - Abnormal; Notable for the following components:   APPearance CLOUDY (*)    Hgb urine dipstick SMALL (*)    Protein, ur 30 (*)    Leukocytes, UA LARGE (*)    Bacteria, UA RARE (*)    Squamous Epithelial / LPF 0-5 (*)    Non Squamous Epithelial 6-30 (*)    All other components within normal limits  CBC WITH DIFFERENTIAL/PLATELET - Abnormal; Notable for the following components:   RBC 3.56 (*)    Hemoglobin 9.7 (*)    HCT 29.7 (*)    RDW 15.6 (*)    All other components within normal limits  TROPONIN I  TSH    EKG  EKG Interpretation  Date/Time:  Saturday July 26 2017 18:16:27 EST Ventricular Rate:  47 PR Interval:    QRS Duration: 101 QT Interval:  500 QTC Calculation: 443 R Axis:   -40 Text Interpretation:  Sinus bradycardia Left anterior fascicular block Abnormal R-wave progression, early transition Consider anterior infarct Confirmed by Benjiman Core [phone]) on 07/26/2017 6:22:09 PM        Radiology Dg Chest 2 View  Result Date: 07/26/2017 CLINICAL DATA:  84 year old with chronic indwelling Foley catheter, personal history of C3 fracture in September, 2018, presenting with intermittent confusion and generalized weakness which began this week. EXAM: CHEST  2 VIEW COMPARISON:  Chest x-ray and CT chest 02/21/2017. FINDINGS: AP erect and lateral images were obtained. Suboptimal inspiration. Cardiac silhouette mildly to moderately enlarged, unchanged. Thoracic aorta atherosclerotic, unchanged. Hilar and mediastinal contours otherwise unremarkable. Chronic elevation of the right hemidiaphragm with chronic scar/atelectasis involving the right lower lobe and right middle lobe. Airspace consolidation with air bronchograms involving the medial left lower lobe. Lungs otherwise clear. Pulmonary vascularity normal. Possible small left pleural effusion. IMPRESSION: 1. Suboptimal inspiration. Acute left lower lobe pneumonia. Possible small left pleural effusion. 2. Chronic elevation the right hemidiaphragm with chronic scar/atelectasis involving the right lower lobe and right middle lobe. 3. Stable mild-to-moderate cardiomegaly. Pulmonary venous hypertension without overt edema. Electronically Signed   By: Hulan Saas M.D.   On: 07/26/2017 19:20    Procedures Procedures (including critical care time)  Medications Ordered in ED Medications  piperacillin-tazobactam (ZOSYN) IVPB 3.375 g (3.375 g Intravenous New Bag/Given 07/26/17 2021)  sodium chloride 0.9 % bolus 500 mL (0 mLs Intravenous Stopped 07/26/17 2021)     Initial Impression / Assessment and Plan / ED Course  I have reviewed the triage vital signs and the nursing notes.  Pertinent labs & imaging results that were available during my care of the patient were reviewed by me and considered in my medical decision making (see chart for details).     Patient with mental status changes.  Appears dehydrated and also has potential  pneumonia and left lung.  Hyponatremia and dehydration on labs.  Will admit to hospitalist for further evaluation and treatment  Final Clinical Impressions(s) / ED Diagnoses   Final diagnoses:  HCAP (healthcare-associated pneumonia)  Dehydration  Hyponatremia    ED Discharge Orders  None       Benjiman CorePickering, Janyth Riera, MD 07/26/17 2031

## 2017-07-26 NOTE — ED Notes (Signed)
Pt transported to xray 

## 2017-07-26 NOTE — Progress Notes (Signed)
Pt is alert to Name and year. Patient does have conversations at times with no one in the room. No agitation.

## 2017-07-26 NOTE — ED Notes (Signed)
EDP at bedside  

## 2017-07-26 NOTE — H&P (Signed)
History and Physical  Justin LisRobert B Salazar BJY:[id number] DOB: 02/06/1927 DOA: 07/26/2017  Referring physician: Dr Rubin PayorPickering, ED physician PCP: Vivien Prestoorrington, Kip A, MD  Outpatient Specialists:    Patient Coming From: home  Chief Complaint: Weakness, confusion, diminished appetite for 3 days  HPI: Justin LisRobert B Salazar is a 84 y.o. male with a history of chronic C3 vertebral fracture, hypertension, GERD, diabetes type 2, hypothyroidism, coronary artery disease, history of atrial fibrillation not on anticoagulation secondary to falls.  Patient recently hospitalized at an outside hospital due to a candidal UTI.  Patient just completed 10 days of fluconazole.  Patient has been becoming progressively weak over the past 3 days with diminishing appetite.  Patient has had little to eat or drink for the past 3 days and is noticed increasing confusion over that time period.  No palliating or provoking factors.  Symptoms are worsening.  Denies fevers, chills, nausea, vomiting.  Denies chest pain.  Does have occasional dry cough and has not had a bowel movement over the past 6 days  Emergency Department Course: Chest x-ray shows left lower lobe pneumonia.  Sodium 121 and BUN elevated to 22.  TSH also elevated to 18 reports being compliant with his medications  Review of Systems:   Pt denies any fevers, chills, nausea, vomiting, diarrhea, constipation, abdominal pain, shortness of breath, dyspnea on exertion, orthopnea, cough, wheezing, palpitations, headache, vision changes, lightheadedness, dizziness, melena, rectal bleeding.  Review of systems are otherwise negative  Past Medical History:  Diagnosis Date  . Coronary artery disease   . Diabetes mellitus without complication (HCC)   . GERD (gastroesophageal reflux disease)   . Hypertension   . Thyroid disease    Past Surgical History:  Procedure Laterality Date  . NASAL SINUS SURGERY    . ORIF NASAL FRACTURE N/A 03/13/2017   Procedure: OPEN REDUCTION  INTERNAL FIXATION (ORIF) NASAL FRACTURE WITH INTERAL AND EXTERNAL SPLINTING;  Surgeon: Peggye Formillingham, Claire S, DO;  Location: [facility];  Service: Plastics;  Laterality: N/A;  . SHOULDER SURGERY    . TONSILLECTOMY     Social History:  reports that  has never smoked. he has never used smokeless tobacco. He reports that he drinks alcohol. He reports that he does not use drugs. Patient lives at home  No Known Allergies  Family History  Problem Relation Age of Onset  . Hypertension Other       Prior to Admission medications   Medication Sig Start Date End Date Taking? Authorizing Provider  amLODipine (NORVASC) 10 MG tablet Take 10 mg by mouth every morning.    Yes [provider]  atorvastatin (LIPITOR) 20 MG tablet Take 20 mg by mouth every morning.  02/14/17  Yes [provider]  b complex vitamins tablet Take 1 tablet by mouth daily.   Yes [provider]  CALCIUM-MAGNESIUM-ZINC PO Take 1 tablet daily by mouth.   Yes [provider]  Cholecalciferol (VITAMIN D3) 1000 units CAPS Take 2,000 Units by mouth daily.    Yes [provider]  doxepin (SINEQUAN) 10 MG capsule Take 10 mg by mouth at bedtime.    Yes [provider]  gabapentin (NEURONTIN) 100 MG capsule Take 100 mg by mouth 3 (three) times daily.  01/28/17  Yes [provider]  levothyroxine (SYNTHROID, LEVOTHROID) 175 MCG tablet Take 175 mcg by mouth daily before breakfast.   Yes [provider]  lipase/protease/amylase (CREON) 12000 UNITS CPEP capsule Take 36,000 Units by mouth 3 (three) times daily  before meals.    Yes [provider]  losartan-hydrochlorothiazide (HYZAAR) 100-25 MG tablet Take 1 tablet by mouth every morning.   Yes [provider]  Melatonin 10 MG TABS Take 10 mg by mouth at bedtime.   Yes [provider]  Menthol-Methyl Salicylate (MUSCLE RUB EX) Apply 1 application at bedtime topically. Apply to legs    Yes [provider]  metFORMIN (GLUCOPHAGE) 1000 MG tablet Take 1,000 mg by mouth 2 (two) times daily with a meal.   Yes [provider]  metoprolol succinate (TOPROL-XL) 25 MG 24 hr tablet Take 12.5 mg by mouth every evening.  11/26/16  Yes [provider]  oxybutynin (DITROPAN-XL) 10 MG 24 hr tablet Take 1 tablet (10 mg total) at bedtime by mouth. 05/11/17  Yes Alekh, Kshitiz, MD  ranitidine (ZANTAC) 150 MG tablet Take 150 mg by mouth 2 (two) times daily.    Yes [provider]  sertraline (ZOLOFT) 100 MG tablet Take 100 mg every morning by mouth.    Yes [provider]  sitaGLIPtin (JANUVIA) 100 MG tablet Take 100 mg at bedtime by mouth.  02/03/17 02/03/18 Yes [provider]  sulfaSALAzine (AZULFIDINE) 500 MG tablet Take 1,000 mg by mouth 2 (two) times daily.    Yes [provider]  tamsulosin (FLOMAX) 0.4 MG CAPS capsule Take 0.4 mg by mouth every evening.   Yes [provider]  vitamin B-12 (CYANOCOBALAMIN) 1000 MCG tablet Take 1,000 mcg daily by mouth.   Yes [provider]  doxycycline (VIBRA-TABS) 100 MG tablet Take 1 tablet (100 mg total) 2 (two) times daily by mouth. Patient not taking: Reported on 07/26/2017 05/11/17   Glade Lloyd, MD  fluconazole (DIFLUCAN) 200 MG tablet Take 400 mg by mouth daily. For UTI starting on 07/13/2017 for 10 days    [provider]    Physical Exam: BP (!) 105/51   Pulse (!) 44   Temp (!) 97.5 F (36.4 C) (Oral)   Resp 17   Ht 5\' 9"  (1.753 m)   Wt 74.8 kg (165 lb)   SpO2 93%   BMI 24.37 kg/m   General: Elderly Caucasian male. Awake and alert and oriented x3. No acute cardiopulmonary distress.  HEENT: Normocephalic.  Has scabs on scalp from previous fall.  Right and left ears normal in appearance.  Pupils equal, round, reactive to light. Extraocular muscles are intact. Sclerae anicteric and noninjected.  Dry mucosal membranes. No mucosal lesions.  Neck: Neck  supple without lymphadenopathy. No carotid bruits. No masses palpated.  Cardiovascular: Regular rate with normal S1-S2 sounds. No murmurs, rubs, gallops auscultated. No JVD.  Respiratory: Good respiratory effort.  No wheezing or rhonchi.  Does have rales in the left base.  No accessory muscle use. Abdomen: Soft, mildly tender in the right lower quadrant, nondistended. Active bowel sounds. No masses or hepatosplenomegaly  Skin: No rashes, lesions, or ulcerations.  Dry, warm to touch. 2+ dorsalis pedis and radial pulses. Musculoskeletal: No calf or leg pain. All major joints not erythematous nontender.  No upper or lower joint deformation.  Good ROM.  No contractures  Psychiatric: Intact judgment and insight. Pleasant and cooperative. Neurologic: No focal neurological deficits. Strength is 5/5 and symmetric in upper and lower extremities.  Cranial nerves II through XII are grossly intact.           Labs on Admission: I have personally reviewed following labs and imaging studies  CBC: Recent Labs  Lab 07/26/17 1927  WBC  6.5  NEUTROABS 4.6  HGB 9.7*  HCT 29.7*  MCV 83.4  PLT 318   Basic Metabolic Panel: Recent Labs  Lab 07/26/17 1927  NA 121*  K 4.8  CL 87*  CO2 21*  GLUCOSE 131*  BUN 22*  CREATININE 1.05  CALCIUM 8.6*   GFR: Estimated Creatinine Clearance: 48.6 mL/min (by C-G formula based on SCr of 1.05 mg/dL). Liver Function Tests: Recent Labs  Lab 07/26/17 1927  AST 27  ALT 15*  ALKPHOS 172*  BILITOT 0.6  PROT 6.4*  ALBUMIN 3.1*   No results for input(s): LIPASE, AMYLASE in the last 168 hours. No results for input(s): AMMONIA in the last 168 hours. Coagulation Profile: No results for input(s): INR, PROTIME in the last 168 hours. Cardiac Enzymes: Recent Labs  Lab 07/26/17 1927  TROPONINI <0.03   BNP (last 3 results) No results for input(s): PROBNP in the last 8760 hours. HbA1C: No results for input(s): HGBA1C in the last 72 hours. CBG: No results for  input(s): GLUCAP in the last 168 hours. Lipid Profile: No results for input(s): CHOL, HDL, LDLCALC, TRIG, CHOLHDL, LDLDIRECT in the last 72 hours. Thyroid Function Tests: Recent Labs    07/26/17 1928  TSH 18.218*   Anemia Panel: No results for input(s): VITAMINB12, FOLATE, FERRITIN, TIBC, IRON, RETICCTPCT in the last 72 hours. Urine analysis:    Component Value Date/Time   COLORURINE YELLOW 07/26/2017 1920   APPEARANCEUR CLOUDY (A) 07/26/2017 1920   LABSPEC 1.014 07/26/2017 1920   PHURINE 5.0 07/26/2017 1920   GLUCOSEU NEGATIVE 07/26/2017 1920   HGBUR SMALL (A) 07/26/2017 1920   BILIRUBINUR NEGATIVE 07/26/2017 1920   KETONESUR NEGATIVE 07/26/2017 1920   PROTEINUR 30 (A) 07/26/2017 1920   NITRITE NEGATIVE 07/26/2017 1920   LEUKOCYTESUR LARGE (A) 07/26/2017 1920   Sepsis Labs: @LABRCNTIP (procalcitonin:4,lacticidven:4) )No results found for this or any previous visit (from the past 240 hour(s)).   Radiological Exams on Admission: Dg Chest 2 View  Result Date: 07/26/2017 CLINICAL DATA:  84 year old with chronic indwelling Foley catheter, personal history of C3 fracture in September, 2018, presenting with intermittent confusion and generalized weakness which began this week. EXAM: CHEST  2 VIEW COMPARISON:  Chest x-ray and CT chest 02/21/2017. FINDINGS: AP erect and lateral images were obtained. Suboptimal inspiration. Cardiac silhouette mildly to moderately enlarged, unchanged. Thoracic aorta atherosclerotic, unchanged. Hilar and mediastinal contours otherwise unremarkable. Chronic elevation of the right hemidiaphragm with chronic scar/atelectasis involving the right lower lobe and right middle lobe. Airspace consolidation with air bronchograms involving the medial left lower lobe. Lungs otherwise clear. Pulmonary vascularity normal. Possible small left pleural effusion. IMPRESSION: 1. Suboptimal inspiration. Acute left lower lobe pneumonia. Possible small left pleural effusion. 2.  Chronic elevation the right hemidiaphragm with chronic scar/atelectasis involving the right lower lobe and right middle lobe. 3. Stable mild-to-moderate cardiomegaly. Pulmonary venous hypertension without overt edema. Electronically Signed   By: Hulan Saas M.D.   On: 07/26/2017 19:20    EKG: Independently reviewed.  Bradycardic rates with left anterior fascicular block.  No acute ST changes.  No change from 05/09/2017  Assessment/Plan: Principal Problem:   Hyponatremia Active Problems:   Diabetes mellitus type 2, controlled (HCC)   Hypothyroidism   Essential hypertension   Dehydration   Failure to thrive in adult   CAP (community acquired pneumonia)   History of atrial fibrillation    This patient was discussed with the ED physician, including pertinent vitals, physical exam findings, labs, and imaging.  We also discussed  care given by the ED provider.  1. Hyponatremia 1. Admit to stepdown 2. We will start fluid hydration 3. Check BMP tomorrow morning 2. Community-acquired pneumonia 1. We will change the patient to azithromycin and ceftriaxone Robitussin Blood cultures drawn in the emergency department Sputum cultures CBC tomorrow Strep antigen by urine 3. Failure to thrive in adult 1. Nutrition consult 4. Dehydration 1. Fluid hydration as above 5. Hypothyroidism 1. Patient states he is compliant with medications 2. Increase levothyroxine to 225 mcg daily 6. Essential hypertension 1. Currently mildly hypotensive, secondary to dehydration 2. Hold antihypertensives 7. Diabetes 1. Sliding scale insulin 2. As patient fairly well controlled, will continue oral medications 8. Atrial fibrillation 1. Currently rhythm controlled 2. No chronic anticoagulation secondary to falls  9. Recent UTI 1. Urine shows large leukocytes 2. We will culture the urine  DVT prophylaxis: Lovenox Consultants: None Code Status: DO NOT RESUSCITATE -this was confirmed by both patient and  son Family Communication: Son present during interview Disposition Plan: Patient to return home following admission   Noralee Chars [facility] Pager [phone]  If 7PM-7AM, please contact night-coverage [email or web address] Password [email or web address]

## 2017-07-26 NOTE — ED Triage Notes (Signed)
Pt had a c3 fracture in sept 2018 and wears a c collar chronically.  Per son, pt has been having intermittent confusion, generalized weakness and urinary symptoms.  Pt has a chronic foley.  Has not been eating or drinking well since Thursday.

## 2017-07-27 ENCOUNTER — Encounter (HOSPITAL_COMMUNITY): Payer: Self-pay | Admitting: Family Medicine

## 2017-07-27 DIAGNOSIS — J189 Pneumonia, unspecified organism: Secondary | ICD-10-CM | POA: Diagnosis present

## 2017-07-27 LAB — GLUCOSE, CAPILLARY
GLUCOSE-CAPILLARY: 109 mg/dL — AB (ref 65–99)
GLUCOSE-CAPILLARY: 132 mg/dL — AB (ref 65–99)
GLUCOSE-CAPILLARY: 65 mg/dL (ref 65–99)
Glucose-Capillary: 78 mg/dL (ref 65–99)
Glucose-Capillary: 98 mg/dL (ref 65–99)
Glucose-Capillary: 137 mg/dL — ABNORMAL HIGH (ref 65–99)

## 2017-07-27 LAB — CBC
HCT: 25.4 % — ABNORMAL LOW (ref 39.0–52.0)
HEMOGLOBIN: 8.3 g/dL — AB (ref 13.0–17.0)
MCH: 27.2 pg (ref 26.0–34.0)
MCHC: 32.7 g/dL (ref 30.0–36.0)
MCV: 83.3 fL (ref 78.0–100.0)
Platelets: 294 10*3/uL (ref 150–400)
RBC: 3.05 MIL/uL — AB (ref 4.22–5.81)
RDW: 15.9 % — ABNORMAL HIGH (ref 11.5–15.5)
WBC: 6.1 10*3/uL (ref 4.0–10.5)

## 2017-07-27 LAB — BASIC METABOLIC PANEL
ANION GAP: 12 (ref 5–15)
BUN: 20 mg/dL (ref 6–20)
CALCIUM: 8.1 mg/dL — AB (ref 8.9–10.3)
CO2: 21 mmol/L — ABNORMAL LOW (ref 22–32)
Chloride: 86 mmol/L — ABNORMAL LOW (ref 101–111)
Creatinine, Ser: 1.08 mg/dL (ref 0.61–1.24)
GFR calc non Af Amer: 59 mL/min — ABNORMAL LOW (ref >60)
Glucose, Bld: 104 mg/dL — ABNORMAL HIGH (ref 65–99)
POTASSIUM: 4.3 mmol/L (ref 3.5–5.1)
Sodium: 119 mmol/L — CL (ref 135–145)

## 2017-07-27 LAB — MRSA PCR SCREENING: MRSA BY PCR: POSITIVE — AB

## 2017-07-27 LAB — STREP PNEUMONIAE URINARY ANTIGEN: Strep Pneumo Urinary Antigen: NEGATIVE

## 2017-07-27 MED ORDER — VANCOMYCIN HCL IN DEXTROSE 1-5 GM/200ML-% IV SOLN
1000.0000 mg | INTRAVENOUS | Status: DC
  Administered 2017-07-28 – 2017-07-30 (×3): 1000 mg via INTRAVENOUS
  Filled 2017-07-27 (×3): qty 200

## 2017-07-27 MED ORDER — SODIUM CHLORIDE 0.9 % IV SOLN
3.0000 g | Freq: Three times a day (TID) | INTRAVENOUS | Status: DC
  Administered 2017-07-27 – 2017-07-30 (×11): 3 g via INTRAVENOUS
  Filled 2017-07-27 (×13): qty 3

## 2017-07-27 MED ORDER — DEXTROSE-NACL 5-0.9 % IV SOLN
INTRAVENOUS | Status: DC
  Administered 2017-07-27 – 2017-07-30 (×5): via INTRAVENOUS

## 2017-07-27 MED ORDER — VANCOMYCIN HCL 10 G IV SOLR
1500.0000 mg | Freq: Once | INTRAVENOUS | Status: AC
  Administered 2017-07-27: 1500 mg via INTRAVENOUS
  Filled 2017-07-27: qty 1500

## 2017-07-27 NOTE — Progress Notes (Signed)
PROGRESS NOTE    Justin LisRobert B Leckey  ZOX:[id number]  DOB: 02/22/1927  DOA: 07/26/2017 PCP: Vivien Prestoorrington, Kip A, MD   Brief Admission Hx: Justin Salazar is a 84 y.o. male with a history of chronic C3 vertebral fracture, hypertension, GERD, diabetes type 2, hypothyroidism, coronary artery disease, history of atrial fibrillation not on anticoagulation secondary to falls.  Patient recently hospitalized at an outside hospital due to a candidal UTI.  Patient just completed 10 days of fluconazole.  Patient has been becoming progressively weak over the past 3 days with diminishing appetite.  Patient has had little to eat or drink for the past 3 days and is noticed increasing confusion over that time period.  No palliating or provoking factors.  Symptoms are worsening.  Denies fevers, chills, nausea, vomiting.  Denies chest pain.  Does have occasional dry cough and has not had a bowel movement over the past 6 days.    MDM/Assessment & Plan:   1. Severe hyponatremia - I suspect that this is secondary to dehydration and ongoing thiazide diuretic use.  This has been discontinued.  The patient has been started on IV normal saline.  We will continue to run the IV saline and will follow BMP.  If no significant improvement we will consult nephrology for assistance.  2. Nosocomial pneumonia-patient recently discharged from another hospital.  He is MRSA positive.  Begin vancomycin and Unasyn IV.  Follow clinically.  Continue supportive therapy. 3. Dehydration-discontinue thiazide diuretics, treating with IV saline hydration. 4. Hypothyroidism- TSH markedly elevated, patient reports compliance with levothyroxine.  Dose was increased to 225 mcg on admission. 5. Essential hypertension-currently holding all antihypertensives due to soft BPs. 6. Hypotension-suspect secondary to dehydration.  IV fluid hydration ordered.  BPs improving with hydration. 7. Type 2 diabetes mellitus- holding home oral medication, supplemental  sliding scale insulin ordered as needed.  Follow CBGs. 8. Chronic atrial fibrillation-heart rate is currently well controlled, holding metoprolol secondary to hypotension, resume metoprolol when blood pressure can tolerate.  He has not been anticoagulated because of fall risk.  DVT prophylaxis: Lovenox Code Status: DNR Family Communication:  Disposition Plan: To be determined  Subjective: Patient reports that he is starting to feel better, he is oriented.  Objective: Vitals:   07/27/17 0600 07/27/17 0700 07/27/17 0807 07/27/17 1113  BP: (!) 113/48 (!) 112/49    Pulse: (!) 54 (!) 54 (!) 54 (!) 52  Resp: 14 17 15 18   Temp:   (!) 97.1 F (36.2 C) (!) 97.2 F (36.2 C)  TempSrc:   Axillary Axillary  SpO2: 93% 98% 98% 95%  Weight:      Height:        Intake/Output Summary (Last 24 hours) at 07/27/2017 1236 Last data filed at 07/27/2017 0700 Gross per 24 hour  Intake 2081.67 ml  Output 400 ml  Net 1681.67 ml   Filed Weights   07/26/17 1815 07/26/17 2224  Weight: 74.8 kg (165 lb) 76.6 kg (168 lb 14 oz)     REVIEW OF SYSTEMS  As per history otherwise all reviewed and reported negative  Exam:  General exam: Awake alert, oriented, no distress, lying in the bed.  Dry mucous membranes.  Neck is in a brace. Respiratory system:  No increased work of breathing. Cardiovascular system: S1 & S2 heard. No JVD, murmurs, gallops, clicks or pedal edema. Gastrointestinal system: Abdomen is nondistended, soft and nontender. Normal bowel sounds heard. Central nervous system: Alert and oriented. No focal neurological deficits. Extremities: no CCE.  Data Reviewed: Basic Metabolic Panel: Recent Labs  Lab 07/26/17 1927 07/26/17 2239 07/27/17 0414  NA 121* 121* 119*  K 4.8 4.8 4.3  CL 87* 87* 86*  CO2 21* 21* 21*  GLUCOSE 131* 123* 104*  BUN 22* 21* 20  CREATININE 1.05 1.05 1.08  CALCIUM 8.6* 8.5* 8.1*   Liver Function Tests: Recent Labs  Lab 07/26/17 1927  AST 27  ALT 15*    ALKPHOS 172*  BILITOT 0.6  PROT 6.4*  ALBUMIN 3.1*   No results for input(s): LIPASE, AMYLASE in the last 168 hours. No results for input(s): AMMONIA in the last 168 hours. CBC: Recent Labs  Lab 07/26/17 1927 07/27/17 0414  WBC 6.5 6.1  NEUTROABS 4.6  --   HGB 9.7* 8.3*  HCT 29.7* 25.4*  MCV 83.4 83.3  PLT 318 294   Cardiac Enzymes: Recent Labs  Lab 07/26/17 1927  TROPONINI <0.03   CBG (last 3)  Recent Labs    07/26/17 2232 07/27/17 0803 07/27/17 1111  GLUCAP 121* 109* 132*   Recent Results (from the past 240 hour(s))  MRSA PCR Screening     Status: Abnormal   Collection Time: 07/26/17 10:16 PM  Result Value Ref Range Status   MRSA by PCR POSITIVE (A) NEGATIVE Final    Comment:        The GeneXpert MRSA Assay (FDA approved for NASAL specimens only), is one component of a comprehensive MRSA colonization surveillance program. It is not intended to diagnose MRSA infection nor to guide or monitor treatment for MRSA infections. CRITICAL RESULT CALLED TO, READ BACK BY AND VERIFIED WITH: DANIELS,J @ 0134 ON 07/27/17 BY JUW Performed at [facility], [address], [place], [place] [address]   Culture, blood (routine x 2) Call MD if unable to obtain prior to antibiotics being given     Status: None (Preliminary result)   Collection Time: 07/26/17 10:32 PM  Result Value Ref Range Status   Specimen Description BLOOD RIGHT ARM  Final   Special Requests   Final    BOTTLES DRAWN AEROBIC AND ANAEROBIC Blood Culture adequate volume   Culture   Final    NO GROWTH < 12 HOURS Performed at [facility], [address] [place], [place] [address]    Report Status PENDING  Incomplete  Culture, blood (routine x 2) Call MD if unable to obtain prior to antibiotics being given     Status: None (Preliminary result)   Collection Time: 07/26/17 10:39 PM  Result Value Ref Range Status   Specimen Description BLOOD RIGHT ARM  Final   Special Requests   Final    BOTTLES DRAWN  AEROBIC AND ANAEROBIC Blood Culture adequate volume   Culture   Final    NO GROWTH < 12 HOURS Performed at [facility], [address], [place], [place] [address]    Report Status PENDING  Incomplete     Studies: Dg Chest 2 View  Result Date: 07/26/2017 CLINICAL DATA:  84 year old with chronic indwelling Foley catheter, personal history of C3 fracture in September, 2018, presenting with intermittent confusion and generalized weakness which began this week. EXAM: CHEST  2 VIEW COMPARISON:  Chest x-ray and CT chest 02/21/2017. FINDINGS: AP erect and lateral images were obtained. Suboptimal inspiration. Cardiac silhouette mildly to moderately enlarged, unchanged. Thoracic aorta atherosclerotic, unchanged. Hilar and mediastinal contours otherwise unremarkable. Chronic elevation of the right hemidiaphragm with chronic scar/atelectasis involving the right lower lobe and right middle lobe. Airspace consolidation with air bronchograms  involving the medial left lower lobe. Lungs otherwise clear. Pulmonary vascularity normal. Possible small left pleural effusion. IMPRESSION: 1. Suboptimal inspiration. Acute left lower lobe pneumonia. Possible small left pleural effusion. 2. Chronic elevation the right hemidiaphragm with chronic scar/atelectasis involving the right lower lobe and right middle lobe. 3. Stable mild-to-moderate cardiomegaly. Pulmonary venous hypertension without overt edema. Electronically Signed   By: Hulan Saas M.D.   On: 07/26/2017 19:20   Scheduled Meds: . atorvastatin  20 mg Oral q morning - 10a  . doxepin  10 mg Oral QHS  . enoxaparin (LOVENOX) injection  40 mg Subcutaneous Q24H  . famotidine  20 mg Oral BID  . gabapentin  100 mg Oral TID  . insulin aspart  0-15 Units Subcutaneous TID WC  . insulin aspart  0-5 Units Subcutaneous QHS  . levothyroxine  225 mcg Oral QAC breakfast  . linagliptin  5 mg Oral Daily  . lipase/protease/amylase  36,000 Units Oral TID AC  . sertraline  100  mg Oral BH-q7a  . sulfaSALAzine  1,000 mg Oral BID  . tamsulosin  0.4 mg Oral QPM   Continuous Infusions: . sodium chloride 100 mL/hr at 07/27/17 1145  . ampicillin-sulbactam (UNASYN) IV Stopped (07/27/17 1023)  . vancomycin Stopped (07/27/17 1347)  . [START ON 07/28/2017] vancomycin      Principal Problem:   Hyponatremia Active Problems:   Diabetes mellitus type 2, controlled (HCC)   Hypothyroidism   Essential hypertension   Dehydration   Failure to thrive in adult   CAP (community acquired pneumonia)   History of atrial fibrillation  Critical Care Time spent: 32 mins  Standley Dakins, MD, FAAFP [facility] Pager [phone]  If 7PM-7AM, please contact night-coverage [email or web address] Password [email or web address] 07/27/2017, 12:36 PM    LOS: 1 day

## 2017-07-27 NOTE — Progress Notes (Signed)
Pharmacy Antibiotic Note  Justin LisRobert B Salazar is a 84 y.o. male admitted on 07/26/2017 with pneumonia.  Pharmacy has been consulted for unasyn and vancomycin dosing.  Plan: Vancomycin 1500 mg IV x 1 then 1gm IV q24 hours Unasyn 3gm IV q8 hours F/u renal function, cultures and clinical course  Height: 5\' 9"  (175.3 cm) Weight: 168 lb 14 oz (76.6 kg) IBW/kg (Calculated) : 70.7  Temp (24hrs), Avg:97.5 F (36.4 C), Min:97.1 F (36.2 C), Max:98 F (36.7 C)  Recent Labs  Lab 07/26/17 1927 07/26/17 2239 07/27/17 0414  WBC 6.5  --  6.1  CREATININE 1.05 1.05  --     Estimated Creatinine Clearance: 48.6 mL/min (by C-G formula based on SCr of 1.05 mg/dL).    No Known Allergies  Antimicrobials this admission: vanc 2/3 >>  Unasyn 2/3 >>  Azithromycin 2/2 1 dose Rocephin 2/2 1 dose Zosyn 3.375  2/2 1 dose   Thank you for allowing pharmacy to be a part of this patient's care.  Justin Salazar, Justin Salazar 07/27/2017 10:38 AM

## 2017-07-27 NOTE — Progress Notes (Addendum)
07/27/2017 6:05 PM  Notified about low temp, will start warming blanket and warm IVFs, add dextrose to IVFs. Check AM cortisol. Follow closely in SDU.  [name]. Johnson MD

## 2017-07-27 NOTE — Progress Notes (Signed)
Pt is on fluid warmer only with temperature monitored continually by rectal probe. Fluid warmer in use. At this time patient is awake and oriented to person, self, and type of facility. BP remains stable and Heart rate is within normal limits. Patient is in no obvious or stated distress.

## 2017-07-28 DIAGNOSIS — T68XXXA Hypothermia, initial encounter: Secondary | ICD-10-CM | POA: Diagnosis present

## 2017-07-28 LAB — CBC
HCT: 27 % — ABNORMAL LOW (ref 39.0–52.0)
HEMOGLOBIN: 8.8 g/dL — AB (ref 13.0–17.0)
MCH: 27.2 pg (ref 26.0–34.0)
MCHC: 32.6 g/dL (ref 30.0–36.0)
MCV: 83.3 fL (ref 78.0–100.0)
Platelets: 310 10*3/uL (ref 150–400)
RBC: 3.24 MIL/uL — ABNORMAL LOW (ref 4.22–5.81)
RDW: 15.8 % — ABNORMAL HIGH (ref 11.5–15.5)
WBC: 5.3 10*3/uL (ref 4.0–10.5)

## 2017-07-28 LAB — BASIC METABOLIC PANEL
Anion gap: 12 (ref 5–15)
BUN: 15 mg/dL (ref 6–20)
CO2: 22 mmol/L (ref 22–32)
CREATININE: 0.95 mg/dL (ref 0.61–1.24)
Calcium: 8 mg/dL — ABNORMAL LOW (ref 8.9–10.3)
Chloride: 89 mmol/L — ABNORMAL LOW (ref 101–111)
GFR calc Af Amer: >60 mL/min (ref >60)
GFR calc non Af Amer: >60 mL/min (ref >60)
GLUCOSE: 142 mg/dL — AB (ref 65–99)
Potassium: 3.9 mmol/L (ref 3.5–5.1)
Sodium: 123 mmol/L — ABNORMAL LOW (ref 135–145)

## 2017-07-28 LAB — GLUCOSE, CAPILLARY
GLUCOSE-CAPILLARY: 163 mg/dL — AB (ref 65–99)
Glucose-Capillary: 155 mg/dL — ABNORMAL HIGH (ref 65–99)
Glucose-Capillary: 104 mg/dL — ABNORMAL HIGH (ref 65–99)

## 2017-07-28 LAB — URINE CULTURE: CULTURE: NO GROWTH

## 2017-07-28 LAB — CORTISOL: CORTISOL PLASMA: 10.2 ug/dL

## 2017-07-28 MED ORDER — ENSURE ENLIVE PO LIQD
237.0000 mL | Freq: Two times a day (BID) | ORAL | Status: DC
  Administered 2017-07-28 – 2017-08-01 (×7): 237 mL via ORAL

## 2017-07-28 NOTE — Progress Notes (Signed)
Pt up to chair with mod assistance. PT recommending Inpt rehab for weakness. pt and family states they can take care of him at home. RN spoke with son with pt updates this am and agreeable that he can take care of him at home.

## 2017-07-28 NOTE — Progress Notes (Signed)
Initial Nutrition Assessment  DOCUMENTATION CODES:   Not applicable  INTERVENTION:  Ensure Enlive po BID, each supplement provides 350 kcal and 20 grams of protein   Recommend consider liberalize diet to regular    NUTRITION DIAGNOSIS:   Inadequate oral intake related to acute illness as evidenced by percent weight loss.  GOAL:   Patient will meet greater than or equal to 90% of their needs MONITOR:   PO intake, Supplement acceptance, Weight trends, Labs  REASON FOR ASSESSMENT:   Consult Assessment of nutrition requirement/status  ASSESSMENT: Justin Salazar is an 84 yo male with hx of HTN, DM 2, CAD and GERD. He has chronic C3 vertebral fracture (since last September).   He is eating lunch 25-50% consumed.  [facility] PT is recommending SNF at discharge. .  The pt says he usually eats cold cereal with fruit, coffee and juice for breakfast. Lunch is usually some type of deli sandwich and dinner a hot meal including protein source and vegetables. He drinks tea with lunch and dinner. He is able to feed himself. He has on a cervical collar which he says has limited his ability to enjoy his food as he normally would.   His wt is down a few pounds from usual wt of 184 lb. He weighted as much as 200 lb back in December of 2016.    Labs: Sodium 123   Meds: Pepcid  Recent Labs  Lab 07/26/17 2239 07/27/17 0414 07/28/17 0424  NA 121* 119* 123*  K 4.8 4.3 3.9  CL 87* 86* 89*  CO2 21* 21* 22  BUN 21* 20 15  CREATININE 1.05 1.08 0.95  CALCIUM 8.5* 8.1* 8.0*  GLUCOSE 123* 104* 142*     NUTRITION - FOCUSED PHYSICAL EXAM: the patient has multiple areas of mild muscle loss. Expect this is both related to a failure to meet nutrition intake goals as well as age related sarcopenia.    Diet Order:  Diet heart healthy/carb modified Room service appropriate? Yes; Fluid consistency: Thin  EDUCATION NEEDS:   Education needs have been addressed Skin:   Skin Assessment: Reviewed RN Assessment  Last BM:  2/3   Height:   Ht Readings from Last 1 Encounters:  07/26/17 5\' 9"  (1.753 m)    Weight:   Wt Readings from Last 1 Encounters:  07/28/17 179 lb 7.3 oz (81.4 kg)    Ideal Body Weight:  73 kg  BMI:  Body mass index is 26.5 kg/m.  Estimated Nutritional Needs:   Kcal:  1800-2000  Protein:  93-99 gr  Fluid:  per MD goals   Royann ShiversLynn Emalyn Schou MS,RD,CSG,LDN Office: [phone] Pager: [phone]

## 2017-07-28 NOTE — Progress Notes (Signed)
PROGRESS NOTE    Justin Salazar  ZOX:[id number]  DOB: [birth year]  DOA: 07/26/2017 PCP: Vivien Presto, MD   Brief Admission Hx: Justin Salazar is a 84 y.o. male with a history of chronic C3 vertebral fracture, hypertension, GERD, diabetes type 2, hypothyroidism, coronary artery disease, history of atrial fibrillation not on anticoagulation secondary to falls.  Patient recently hospitalized at an outside hospital due to a candidal UTI.  Patient just completed 10 days of fluconazole.  Patient has been becoming progressively weak over the past 3 days with diminishing appetite.  Patient has had little to eat or drink for the past 3 days and is noticed increasing confusion over that time period.  No palliating or provoking factors.  Symptoms are worsening.  Denies fevers, chills, nausea, vomiting.  Denies chest pain.  Does have occasional dry cough and has not had a bowel movement over the past 6 days.    MDM/Assessment & Plan:   1. Severe hyponatremia - slightly improved today.  I suspect that this is secondary to dehydration and ongoing thiazide diuretic use.  Thiazide has been discontinued.  The patient has been started on IV normal saline.  We will continue to run the IV saline and will follow BMP.  If no significant improvement we will consult nephrology for assistance.  2. Nosocomial pneumonia-patient recently discharged from another hospital.  He is MRSA positive.  Begin vancomycin and Unasyn IV.  Follow clinically.  Continue supportive therapy. 3. Dehydration-discontinue thiazide diuretics, treating with IV saline hydration. 4. Hypothermia - Unknown cause, treated with warmed IVFs and warming blanket and now temps are back to normal.  5. Hypothyroidism- TSH markedly elevated, patient reports compliance with levothyroxine.  Dose was increased to 225 mcg on admission. 6. Essential hypertension-currently holding all antihypertensives due to soft BPs. 7. Hypotension-suspect secondary to  dehydration.  IV fluid hydration ordered.  BPs improving with hydration. 8. Type 2 diabetes mellitus- holding home oral medication, supplemental sliding scale insulin ordered as needed.  Follow CBGs. 9. Chronic atrial fibrillation-heart rate is currently well controlled, holding metoprolol secondary to hypotension, resume metoprolol when blood pressure can tolerate.  He has not been anticoagulated because of fall risk.  DVT prophylaxis: Lovenox Code Status: DNR Family Communication:  Disposition Plan: Family want to take home despite SNF recommendation  Subjective: Patient awake, oriented, denies complaints today.   Objective: Vitals:   07/28/17 0825 07/28/17 0900 07/28/17 1000 07/28/17 1100  BP:  126/74 92/62 (!) 111/57  Pulse:   77 72  Resp:  19 (!) 27 15  Temp: 98.1 F (36.7 C)     TempSrc: Rectal     SpO2:   98% 95%  Weight:      Height:        Intake/Output Summary (Last 24 hours) at 07/28/2017 1113 Last data filed at 07/28/2017 0500 Gross per 24 hour  Intake 2301.67 ml  Output 2350 ml  Net -48.33 ml   Filed Weights   07/26/17 1815 07/26/17 2224 07/28/17 0500  Weight: 74.8 kg (165 lb) 76.6 kg (168 lb 14 oz) 81.4 kg (179 lb 7.3 oz)     REVIEW OF SYSTEMS  As per history otherwise all reviewed and reported negative  Exam:  General exam: Awake alert, oriented, no distress, lying in the bed.  Dry mucous membranes.  Neck is in a brace. Respiratory system:  No increased work of breathing. Cardiovascular system: S1 & S2 heard. No JVD, murmurs, gallops, clicks or pedal edema. Gastrointestinal system:  Abdomen is nondistended, soft and nontender. Normal bowel sounds heard. Central nervous system: Alert and oriented. No focal neurological deficits. Extremities: no CCE.  Data Reviewed: Basic Metabolic Panel: Recent Labs  Lab 07/26/17 1927 07/26/17 2239 07/27/17 0414 07/28/17 0424  NA 121* 121* 119* 123*  K 4.8 4.8 4.3 3.9  CL 87* 87* 86* 89*  CO2 21* 21* 21* 22    GLUCOSE 131* 123* 104* 142*  BUN 22* 21* 20 15  CREATININE 1.05 1.05 1.08 0.95  CALCIUM 8.6* 8.5* 8.1* 8.0*   Liver Function Tests: Recent Labs  Lab 07/26/17 1927  AST 27  ALT 15*  ALKPHOS 172*  BILITOT 0.6  PROT 6.4*  ALBUMIN 3.1*   No results for input(s): LIPASE, AMYLASE in the last 168 hours. No results for input(s): AMMONIA in the last 168 hours. CBC: Recent Labs  Lab 07/26/17 1927 07/27/17 0414 07/28/17 0424  WBC 6.5 6.1 5.3  NEUTROABS 4.6  --   --   HGB 9.7* 8.3* 8.8*  HCT 29.7* 25.4* 27.0*  MCV 83.4 83.3 83.3  PLT 318 294 310   Cardiac Enzymes: Recent Labs  Lab 07/26/17 1927  TROPONINI <0.03   CBG (last 3)  Recent Labs    07/27/17 1937 07/27/17 2304 07/28/17 0824  GLUCAP 98 137* 163*   Recent Results (from the past 240 hour(s))  MRSA PCR Screening     Status: Abnormal   Collection Time: 07/26/17 10:16 PM  Result Value Ref Range Status   MRSA by PCR POSITIVE (A) NEGATIVE Final    Comment:        The GeneXpert MRSA Assay (FDA approved for NASAL specimens only), is one component of a comprehensive MRSA colonization surveillance program. It is not intended to diagnose MRSA infection nor to guide or monitor treatment for MRSA infections. CRITICAL RESULT CALLED TO, READ BACK BY AND VERIFIED WITH: DANIELS,J @ 0134 ON 07/27/17 BY JUW Performed at [facility], [address], [place], [place] [address]   Culture, blood (routine x 2) Call MD if unable to obtain prior to antibiotics being given     Status: None (Preliminary result)   Collection Time: 07/26/17 10:32 PM  Result Value Ref Range Status   Specimen Description BLOOD RIGHT ARM  Final   Special Requests   Final    BOTTLES DRAWN AEROBIC AND ANAEROBIC Blood Culture adequate volume   Culture   Final    NO GROWTH 2 DAYS Performed at [facility], [address] [place], [place] [address]    Report Status PENDING  Incomplete  Culture, blood (routine x 2) Call MD if unable to obtain prior to  antibiotics being given     Status: None (Preliminary result)   Collection Time: 07/26/17 10:39 PM  Result Value Ref Range Status   Specimen Description BLOOD RIGHT ARM  Final   Special Requests   Final    BOTTLES DRAWN AEROBIC AND ANAEROBIC Blood Culture adequate volume   Culture   Final    NO GROWTH 2 DAYS Performed at [facility], [address], [place], [place] [address]    Report Status PENDING  Incomplete  Culture, Urine     Status: None   Collection Time: 07/26/17 11:24 PM  Result Value Ref Range Status   Specimen Description   Final    URINE, CATHETERIZED Performed at [facility], [address], [place], [place] [address]    Special Requests   Final    NONE Performed at [facility],  [address], [place], [place] [address]    Culture   Final    NO GROWTH Performed at [facility], [address], [place], [place] [address]    Report Status 07/28/2017 FINAL  Final  Culture, blood (Routine X 2) w Reflex to ID Panel     Status: None (Preliminary result)   Collection Time: 07/27/17  7:35 PM  Result Value Ref Range Status   Specimen Description BLOOD LEFT HAND  Final   Special Requests   Final    BOTTLES DRAWN AEROBIC AND ANAEROBIC Blood Culture adequate volume   Culture   Final    NO GROWTH < 12 HOURS Performed at [facility], [address], [place], [place] [address]    Report Status PENDING  Incomplete  Culture, blood (Routine X 2) w Reflex to ID Panel     Status: None (Preliminary result)   Collection Time: 07/27/17  7:48 PM  Result Value Ref Range Status   Specimen Description BLOOD RIGHT ARM  Final   Special Requests   Final    BOTTLES DRAWN AEROBIC AND ANAEROBIC Blood Culture adequate volume   Culture   Final    NO GROWTH < 12 HOURS Performed at [facility], [address], [place], [place] [address]    Report Status PENDING  Incomplete     Studies: Dg Chest 2 View  Result Date: 07/26/2017 CLINICAL DATA:  84 year old with chronic indwelling  Foley catheter, personal history of C3 fracture in September, 2018, presenting with intermittent confusion and generalized weakness which began this week. EXAM: CHEST  2 VIEW COMPARISON:  Chest x-ray and CT chest 02/21/2017. FINDINGS: AP erect and lateral images were obtained. Suboptimal inspiration. Cardiac silhouette mildly to moderately enlarged, unchanged. Thoracic aorta atherosclerotic, unchanged. Hilar and mediastinal contours otherwise unremarkable. Chronic elevation of the right hemidiaphragm with chronic scar/atelectasis involving the right lower lobe and right middle lobe. Airspace consolidation with air bronchograms involving the medial left lower lobe. Lungs otherwise clear. Pulmonary vascularity normal. Possible small left pleural effusion. IMPRESSION: 1. Suboptimal inspiration. Acute left lower lobe pneumonia. Possible small left pleural effusion. 2. Chronic elevation the right hemidiaphragm with chronic scar/atelectasis involving the right lower lobe and right middle lobe. 3. Stable mild-to-moderate cardiomegaly. Pulmonary venous hypertension without overt edema. Electronically Signed   By: Hulan Saas M.D.   On: 07/26/2017 19:20   Scheduled Meds: . atorvastatin  20 mg Oral q morning - 10a  . doxepin  10 mg Oral QHS  . enoxaparin (LOVENOX) injection  40 mg Subcutaneous Q24H  . famotidine  20 mg Oral BID  . gabapentin  100 mg Oral TID  . insulin aspart  0-15 Units Subcutaneous TID WC  . insulin aspart  0-5 Units Subcutaneous QHS  . levothyroxine  225 mcg Oral QAC breakfast  . lipase/protease/amylase  36,000 Units Oral TID AC  . sertraline  100 mg Oral BH-q7a  . sulfaSALAzine  1,000 mg Oral BID  . tamsulosin  0.4 mg Oral QPM   Continuous Infusions: . ampicillin-sulbactam (UNASYN) IV Stopped (07/28/17 0932)  . dextrose 5 % and 0.9% NaCl 100 mL/hr at 07/27/17 2200  . vancomycin 1,000 mg (07/28/17 1036)    Principal Problem:   Hyponatremia Active Problems:   HCAP  (healthcare-associated pneumonia)   Diabetes mellitus type 2, controlled (HCC)   Hypothyroidism   Essential hypertension   Dehydration   Failure to thrive in adult   History of atrial fibrillation   Hypothermia  Critical Care Time  spent: 33 mins  Standley Dakinslanford Sequoya Hogsett, MD, FAAFP [facility] Pager [phone]  If 7PM-7AM, please contact night-coverage [email or web address] Password [email or web address] 07/28/2017, 11:13 AM    LOS: 2 days

## 2017-07-28 NOTE — Plan of Care (Signed)
  Acute Rehab PT Goals(only PT should resolve) Pt Will Go Supine/Side To Sit 07/28/2017 1329 - Progressing by Ocie BobWatkins, Emanuele Mcwhirter, PT Flowsheets Taken 07/28/2017 1329  Pt will go Supine/Side to Sit with minimal assist Patient Will Transfer Sit To/From Stand 07/28/2017 1329 - Progressing by Ocie BobWatkins, Faith Patricelli, PT Flowsheets Taken 07/28/2017 1329  Patient will transfer sit to/from stand with minimal assist Pt Will Transfer Bed To Chair/Chair To Bed 07/28/2017 1329 - Progressing by Ocie BobWatkins, Akiva Josey, PT Flowsheets Taken 07/28/2017 1329  Pt will Transfer Bed to Chair/Chair to Bed with min assist Pt Will Ambulate 07/28/2017 1329 - Progressing by Ocie BobWatkins, Telicia Hodgkiss, PT Flowsheets Taken 07/28/2017 1329  Pt will Ambulate 25 feet;with minimal assist;with rolling walker  1:30 PM, 07/28/17 Ocie BobJames Delesa Kawa, MPT Physical Therapist with [facility] 336 [phone] office [phone] mobile phone

## 2017-07-28 NOTE — Evaluation (Signed)
Clinical/Bedside Swallow Evaluation Patient Details  Name: Justin Salazar MRN: [id number] Date of Birth: 1927-06-10  Today's Date: 07/28/2017 Time: SLP Start Time (ACUTE ONLY): 1507 SLP Stop Time (ACUTE ONLY): 1525 SLP Time Calculation (min) (ACUTE ONLY): 18 min  Past Medical History:  Past Medical History:  Diagnosis Date  . Coronary artery disease   . Diabetes mellitus without complication (HCC)   . GERD (gastroesophageal reflux disease)   . Hypertension   . Thyroid disease    Past Surgical History:  Past Surgical History:  Procedure Laterality Date  . NASAL SINUS SURGERY    . ORIF NASAL FRACTURE N/A 03/13/2017   Procedure: OPEN REDUCTION INTERNAL FIXATION (ORIF) NASAL FRACTURE WITH INTERAL AND EXTERNAL SPLINTING;  Surgeon: Peggye Form, DO;  Location: [facility];  Service: Plastics;  Laterality: N/A;  . SHOULDER SURGERY    . TONSILLECTOMY     HPI:  84 y.o. male with a history of chronic C3 vertebral fracture, hypertension, GERD, diabetes type 2, hypothyroidism, coronary artery disease, history of atrial fibrillation not on anticoagulation secondary to falls.  Patient recently hospitalized at an outside hospital due to a candidal UTI.  Patient just completed 10 days of fluconazole.  Patient has been becoming progressively weak over the past 3 days with diminishing appetite.  Patient has had little to eat or drink for the past 3 days and is noticed increasing confusion over that time period.  No palliating or provoking factors.  Symptoms are worsening.  Denies fevers, chills, nausea, vomiting.  Denies chest pain.  Does have occasional dry cough and has not had a bowel movement over the past 6 days; CXR on 07/26/17 indicated Suboptimal inspiration. Acute left lower lobe pneumonia. Possible small left pleural effusion. 2. Chronic elevation the right hemidiaphragm with chronic scar/atelectasis involving the right lower lobe and right middle lobe. 3. Stable  mild-to-moderate cardiomegaly. Pulmonary venous hypertension without overt edema.  Assessment / Plan / Recommendation Clinical Impression   Pt appears to exhibit a normal oropharyngeal swallow with all consistencies assessed with no overt s/s of aspiration noted throughout BSE; pt denies dysphagia previously; recommend Regular (heart healthy/carb modified) diet with thin liquids via cup/straw; ST will sign off at this time; thank you for this consult. SLP Visit Diagnosis: Dysphagia, unspecified (R13.10)    Aspiration Risk  Mild aspiration risk    Diet Recommendation   Regular (heart healthy/carb modified)/thin liquids  Medication Administration: Whole meds with puree    Other  Recommendations Oral Care Recommendations: Oral care BID   Follow up Recommendations None      Frequency and Duration   n/a         Prognosis Prognosis for Safe Diet Advancement: Good      Swallow Study   General Date of Onset: 07/26/17 HPI: 84 y.o. male with a history of chronic C3 vertebral fracture, hypertension, GERD, diabetes type 2, hypothyroidism, coronary artery disease, history of atrial fibrillation not on anticoagulation secondary to falls.  Patient recently hospitalized at an outside hospital due to a candidal UTI.  Patient just completed 10 days of fluconazole.  Patient has been becoming progressively weak over the past 3 days with diminishing appetite.  Patient has had little to eat or drink for the past 3 days and is noticed increasing confusion over that time period.  No palliating or provoking factors.  Symptoms are worsening.  Denies fevers, chills, nausea, vomiting.  Denies chest pain.  Does have occasional dry cough and has not had a  bowel movement over the past 6 days Type of Study: Bedside Swallow Evaluation Previous Swallow Assessment: n/a Diet Prior to this Study: Regular;Thin liquids Temperature Spikes Noted: No Respiratory Status: Room air History of Recent Intubation:  No Behavior/Cognition: Alert;Cooperative;Pleasant mood Oral Cavity Assessment: Within Functional Limits Oral Care Completed by SLP: No Oral Cavity - Dentition: Adequate natural dentition Vision: Functional for self-feeding Self-Feeding Abilities: Able to feed self Patient Positioning: Upright in bed Baseline Vocal Quality: Normal Volitional Cough: Strong Volitional Swallow: Able to elicit    Oral/Motor/Sensory Function Overall Oral Motor/Sensory Function: Within functional limits   Ice Chips Ice chips: Not tested   Thin Liquid Thin Liquid: Within functional limits Presentation: Cup;Straw    Nectar Thick Nectar Thick Liquid: Not tested   Honey Thick Honey Thick Liquid: Not tested   Puree Puree: Within functional limits Presentation: Self Fed   Solid      Solid: Within functional limits Presentation: Self Fed        Tressie StalkerPat Franki Alcaide, M.S., CCC-SLP 07/28/2017,3:51 PM

## 2017-07-28 NOTE — Evaluation (Signed)
Physical Therapy Evaluation Patient Details Name: Justin Salazar MRN: [id number] DOB: 12/10/1926 Today's Date: 07/28/2017   History of Present Illness  Justin Salazar is a 84 y.o. male with a history of chronic C3 vertebral fracture, hypertension, GERD, diabetes type 2, hypothyroidism, coronary artery disease, history of atrial fibrillation not on anticoagulation secondary to falls.  Patient recently hospitalized at an outside hospital due to a candidal UTI.  Patient just completed 10 days of fluconazole.  Patient has been becoming progressively weak over the past 3 days with diminishing appetite.  Patient has had little to eat or drink for the past 3 days and is noticed increasing confusion over that time period.  No palliating or provoking factors.  Symptoms are worsening.  Denies fevers, chills, nausea, vomiting.  Denies chest pain.  Does have occasional dry cough and has not had a bowel movement over the past 6 days    Clinical Impression  Patient demonstrates fair/poor sitting balance with frequent leaning over to the right while seated at bedside, limited to 1-2 minutes of standing/taking steps due to poor balance, BLE weakness and c/o fatigue.  Patient will benefit from continued physical therapy in hospital and recommended venue below to increase strength, balance, endurance for safe ADLs and gait.    Follow Up Recommendations SNF;Supervision for mobility/OOB    Equipment Recommendations  None recommended by PT    Recommendations for Other Services       Precautions / Restrictions Precautions Precautions: Fall Required Braces or Orthoses: Cervical Brace Cervical Brace: Hard collar;At all times Restrictions Weight Bearing Restrictions: No      Mobility  Bed Mobility Overal bed mobility: Needs Assistance Bed Mobility: Supine to Sit     Supine to sit: Mod assist        Transfers Overall transfer level: Needs assistance Equipment used: Rolling walker (2  wheeled) Transfers: Sit to/from [employer] CorporationStand;Stand Pivot Transfers Sit to Stand: Mod assist Stand pivot transfers: Mod assist          Ambulation/Gait Ambulation/Gait assistance: Mod assist Ambulation Distance (Feet): 4 Feet Assistive device: Rolling walker (2 wheeled) Gait Pattern/deviations: Decreased step length - right;Decreased step length - left;Decreased stride length   Gait velocity interpretation: Below normal speed for age/gender General Gait Details: very unsteady, limited to 5-6 steps to transfer to chair due to c/o weakness/fatigue  Stairs            Wheelchair Mobility    Modified Rankin (Stroke Patients Only)       Balance Overall balance assessment: Needs assistance Sitting-balance support: Bilateral upper extremity supported;Feet supported Sitting balance-Leahy Scale: Poor Sitting balance - Comments: fair/poor   Standing balance support: Bilateral upper extremity supported;During functional activity Standing balance-Leahy Scale: Poor Standing balance comment: fair/poor with RW                             Pertinent Vitals/Pain Pain Assessment: No/denies pain    Home Living Family/patient expects to be discharged to:: Private residence Living Arrangements: Alone Available Help at Discharge: Family Type of Home: House Home Access: Stairs to enter Entrance Stairs-Rails: Left Entrance Stairs-Number of Steps: 2 Home Layout: One level Home Equipment: Walker - 4 wheels;Cane - single point;Walker - 2 wheels;Shower seat;Hospital bed;Wheelchair - manual      Prior Function Level of Independence: Independent with assistive device(s)         Comments: uses rollator     Hand Dominance  Extremity/Trunk Assessment   Upper Extremity Assessment Upper Extremity Assessment: Generalized weakness    Lower Extremity Assessment Lower Extremity Assessment: Generalized weakness    Cervical / Trunk Assessment Cervical / Trunk  Assessment: Kyphotic  Communication   Communication: No difficulties  Cognition Arousal/Alertness: Awake/alert Behavior During Therapy: WFL for tasks assessed/performed Overall Cognitive Status: Within Functional Limits for tasks assessed                                        General Comments      Exercises     Assessment/Plan    PT Assessment Patient needs continued PT services  PT Problem List Decreased strength;Decreased activity tolerance;Decreased balance;Decreased mobility       PT Treatment Interventions Gait training;Functional mobility training;Therapeutic activities;Therapeutic exercise;Patient/family education;Stair training    PT Goals (Current goals can be found in the Care Plan section)  Acute Rehab PT Goals Patient Stated Goal: return home with family to assist PT Goal Formulation: With patient Time For Goal Achievement: 08/04/17 Potential to Achieve Goals: Good    Frequency Min 3X/week   Barriers to discharge        Co-evaluation               AM-PAC PT "6 Clicks" Daily Activity  Outcome Measure Difficulty turning over in bed (including adjusting bedclothes, sheets and blankets)?: A Little Difficulty moving from lying on back to sitting on the side of the bed? : A Lot Difficulty sitting down on and standing up from a chair with arms (e.g., wheelchair, bedside commode, etc,.)?: A Lot Help needed moving to and from a bed to chair (including a wheelchair)?: A Lot Help needed walking in hospital room?: A Lot Help needed climbing 3-5 steps with a railing? : A Lot 6 Click Score: 13    End of Session   Activity Tolerance: Patient tolerated treatment well;Patient limited by fatigue Patient left: in chair;with call bell/phone within reach;with nursing/sitter in room Nurse Communication: Mobility status PT Visit Diagnosis: Unsteadiness on feet (R26.81);Other abnormalities of gait and mobility (R26.89);Muscle weakness (generalized)  (M62.81)    Time: 9147-8295 PT Time Calculation (min) (ACUTE ONLY): 30 min   Charges:   PT Evaluation $PT Eval Moderate Complexity: 1 Mod PT Treatments $Therapeutic Activity: 23-37 mins   PT G Codes:        1:29 PM, August 15, 2017 Ocie Bob, MPT Physical Therapist with [facility] 336 [phone] office [phone] mobile phone

## 2017-07-29 LAB — BASIC METABOLIC PANEL
Anion gap: 10 (ref 5–15)
BUN: 12 mg/dL (ref 6–20)
CHLORIDE: 91 mmol/L — AB (ref 101–111)
CO2: 23 mmol/L (ref 22–32)
CREATININE: 0.89 mg/dL (ref 0.61–1.24)
Calcium: 8.3 mg/dL — ABNORMAL LOW (ref 8.9–10.3)
GFR calc Af Amer: >60 mL/min (ref >60)
GFR calc non Af Amer: >60 mL/min (ref >60)
Glucose, Bld: 139 mg/dL — ABNORMAL HIGH (ref 65–99)
Potassium: 3.7 mmol/L (ref 3.5–5.1)
SODIUM: 124 mmol/L — AB (ref 135–145)

## 2017-07-29 LAB — GLUCOSE, CAPILLARY
GLUCOSE-CAPILLARY: 197 mg/dL — AB (ref 65–99)
GLUCOSE-CAPILLARY: 164 mg/dL — AB (ref 65–99)
Glucose-Capillary: 144 mg/dL — ABNORMAL HIGH (ref 65–99)
Glucose-Capillary: 151 mg/dL — ABNORMAL HIGH (ref 65–99)
Glucose-Capillary: 98 mg/dL (ref 65–99)

## 2017-07-29 LAB — CBC
HEMATOCRIT: 28.6 % — AB (ref 39.0–52.0)
Hemoglobin: 9.2 g/dL — ABNORMAL LOW (ref 13.0–17.0)
MCH: 26.8 pg (ref 26.0–34.0)
MCHC: 32.2 g/dL (ref 30.0–36.0)
MCV: 83.4 fL (ref 78.0–100.0)
PLATELETS: 298 10*3/uL (ref 150–400)
RBC: 3.43 MIL/uL — ABNORMAL LOW (ref 4.22–5.81)
RDW: 16.2 % — AB (ref 11.5–15.5)
WBC: 5.9 10*3/uL (ref 4.0–10.5)

## 2017-07-29 MED ORDER — CHLORHEXIDINE GLUCONATE CLOTH 2 % EX PADS
6.0000 | MEDICATED_PAD | Freq: Every day | CUTANEOUS | Status: DC
  Administered 2017-07-29 – 2017-07-31 (×3): 6 via TOPICAL

## 2017-07-29 MED ORDER — IBUPROFEN 400 MG PO TABS
400.0000 mg | ORAL_TABLET | Freq: Four times a day (QID) | ORAL | Status: DC | PRN
  Administered 2017-07-29 – 2017-07-30 (×2): 400 mg via ORAL
  Filled 2017-07-29 (×2): qty 1

## 2017-07-29 MED ORDER — ACETAMINOPHEN 325 MG PO TABS
650.0000 mg | ORAL_TABLET | Freq: Four times a day (QID) | ORAL | Status: DC | PRN
  Administered 2017-08-01: 650 mg via ORAL
  Filled 2017-07-29: qty 2

## 2017-07-29 MED ORDER — MUPIROCIN 2 % EX OINT
1.0000 "application " | TOPICAL_OINTMENT | Freq: Two times a day (BID) | CUTANEOUS | Status: DC
  Administered 2017-07-29 – 2017-08-01 (×6): 1 via NASAL
  Filled 2017-07-29 (×2): qty 22

## 2017-07-29 MED ORDER — PANTOPRAZOLE SODIUM 40 MG PO TBEC
40.0000 mg | DELAYED_RELEASE_TABLET | Freq: Every day | ORAL | Status: DC
  Administered 2017-07-30 – 2017-07-31 (×2): 40 mg via ORAL
  Filled 2017-07-29 (×2): qty 1

## 2017-07-29 NOTE — Progress Notes (Signed)
Physical Therapy Treatment Patient Details Name: Justin LisRobert B Kabler MRN: [id number] DOB: 07/10/1926 Today's Date: 07/29/2017    History of Present Illness Justin Salazar is a 84 y.o. male with a history of chronic C3 vertebral fracture, hypertension, GERD, diabetes type 2, hypothyroidism, coronary artery disease, history of atrial fibrillation not on anticoagulation secondary to falls.  Patient recently hospitalized at an outside hospital due to a candidal UTI.  Patient just completed 10 days of fluconazole.  Patient has been becoming progressively weak over the past 3 days with diminishing appetite.  Patient has had little to eat or drink for the past 3 days and is noticed increasing confusion over that time period.  No palliating or provoking factors.  Symptoms are worsening.  Denies fevers, chills, nausea, vomiting.  Denies chest pain.  Does have occasional dry cough and has not had a bowel movement over the past 6 days    PT Comments    Patient limited to standing for up to 10-20 seconds before having to sit due to BLE weakness and c/o fatigue, unable to take steps or transfer to chair, limited mostly due to SOB/fatigue with O2 sats at 94-95% while on room air.  Patient will benefit from continued physical therapy in hospital and recommended venue below to increase strength, balance, endurance for safe ADLs and gait.   Follow Up Recommendations  SNF;Supervision for mobility/OOB     Equipment Recommendations  None recommended by PT    Recommendations for Other Services       Precautions / Restrictions Precautions Precautions: Fall Required Braces or Orthoses: Cervical Brace Cervical Brace: Hard collar;At all times Restrictions Weight Bearing Restrictions: No    Mobility  Bed Mobility Overal bed mobility: Needs Assistance Bed Mobility: Supine to Sit;Sit to Supine     Supine to sit: Mod assist;Max assist Sit to supine: Mod assist      Transfers Overall transfer level: Needs  assistance Equipment used: Rolling walker (2 wheeled) Transfers: Sit to/from Stand Sit to Stand: Max assist         General transfer comment: unable to tranfer to chair due to weakness  Ambulation/Gait                 Stairs            Wheelchair Mobility    Modified Rankin (Stroke Patients Only)       Balance Overall balance assessment: Needs assistance Sitting-balance support: Bilateral upper extremity supported Sitting balance-Leahy Scale: Fair Sitting balance - Comments: fair/poor   Standing balance support: Bilateral upper extremity supported;During functional activity Standing balance-Leahy Scale: Poor Standing balance comment: poor with RW                            Cognition Arousal/Alertness: Awake/alert Behavior During Therapy: WFL for tasks assessed/performed Overall Cognitive Status: Within Functional Limits for tasks assessed                                        Exercises General Exercises - Lower Extremity Ankle Circles/Pumps: Seated;AROM;Strengthening;Both;10 reps Long Arc Quad: Seated;AROM;Strengthening;Both;10 reps Hip Flexion/Marching: Seated;AROM;Strengthening;Both;10 reps    General Comments        Pertinent Vitals/Pain Pain Assessment: No/denies pain    Home Living  Prior Function            PT Goals (current goals can now be found in the care plan section) Acute Rehab PT Goals Patient Stated Goal: go home after rehab PT Goal Formulation: With patient Time For Goal Achievement: 08/04/17 Potential to Achieve Goals: Good Progress towards PT goals: Progressing toward goals    Frequency    Min 3X/week      PT Plan Current plan remains appropriate    Co-evaluation              AM-PAC PT "6 Clicks" Daily Activity  Outcome Measure  Difficulty turning over in bed (including adjusting bedclothes, sheets and blankets)?: A Little Difficulty moving  from lying on back to sitting on the side of the bed? : A Lot Difficulty sitting down on and standing up from a chair with arms (e.g., wheelchair, bedside commode, etc,.)?: A Lot Help needed moving to and from a bed to chair (including a wheelchair)?: Total Help needed walking in hospital room?: Total Help needed climbing 3-5 steps with a railing? : Total 6 Click Score: 10    End of Session   Activity Tolerance: Patient limited by fatigue Patient left: in bed;with call bell/phone within reach;with bed alarm set Nurse Communication: Mobility status PT Visit Diagnosis: Unsteadiness on feet (R26.81);Other abnormalities of gait and mobility (R26.89);Muscle weakness (generalized) (M62.81)     Time: 1610-9604 PT Time Calculation (min) (ACUTE ONLY): 26 min  Charges:  $Therapeutic Activity: 23-37 mins                    G Codes:       3:32 PM, 2017-08-15 Ocie Bob, MPT Physical Therapist with [facility] [phone] office [phone] mobile phone

## 2017-07-29 NOTE — Progress Notes (Signed)
PROGRESS NOTE    Justin Salazar  UJW:[id number]  DOB: 06/11/1927  DOA: 07/26/2017 PCP: Vivien Prestoorrington, Kip A, MD   Brief Admission Hx: Justin Salazar is a 84 y.o. male with a history of chronic C3 vertebral fracture, hypertension, GERD, diabetes type 2, hypothyroidism, coronary artery disease, history of atrial fibrillation not on anticoagulation secondary to falls.  Patient recently hospitalized at an outside hospital due to a candidal UTI.  Patient just completed 10 days of fluconazole.  Patient has been becoming progressively weak over the past 3 days with diminishing appetite.  Patient has had little to eat or drink for the past 3 days and is noticed increasing confusion over that time period.  No palliating or provoking factors.  Symptoms are worsening.  Denies fevers, chills, nausea, vomiting.  Denies chest pain.  Does have occasional dry cough and has not had a bowel movement over the past 6 days.    MDM/Assessment & Plan:   1. Severe hyponatremia - slowly improving.  I suspect that this is secondary to dehydration and ongoing thiazide diuretic use.  Thiazide has been discontinued.  The patient has been started on IV normal saline.  We will continue to run the IV saline and will follow BMP.  If no significant improvement we will consult nephrology for assistance.  2. Nosocomial pneumonia-patient recently discharged from another hospital.  He is MRSA positive.  Begin vancomycin and Unasyn IV.  Follow clinically.  Continue supportive therapy. 3. Dehydration-discontinue thiazide diuretics, treating with IV saline hydration. 4. Hypothermia - Unknown cause, treated with warmed IVFs and warming blanket and now temps are back to normal.  5. Hypothyroidism- TSH markedly elevated, patient reports compliance with levothyroxine.  Dose was increased to 225 mcg on admission. 6. Essential hypertension-currently holding all antihypertensives due to soft BPs. 7. Hypotension-suspect secondary to  dehydration.  IV fluid hydration ordered.  BPs improving with hydration. 8. Type 2 diabetes mellitus- holding home oral medication, supplemental sliding scale insulin ordered as needed.  Follow CBGs. 9. Chronic atrial fibrillation-heart rate is currently well controlled, holding metoprolol secondary to hypotension, resume metoprolol when blood pressure can tolerate.  He has not been anticoagulated because of fall risk.  DVT prophylaxis: Lovenox Code Status: DNR Family Communication:  Disposition Plan: Family want to take home despite SNF recommendation  Subjective: Patient says that he is feeling a lot better.   Objective: Vitals:   07/29/17 1200 07/29/17 1300 07/29/17 1400 07/29/17 1500  BP: 125/72 139/81 124/73 126/72  Pulse: 83 89 98 97  Resp:      Temp: 97.7 F (36.5 C)     TempSrc:      SpO2: 96% 91% 92% 95%  Weight:      Height:        Intake/Output Summary (Last 24 hours) at 07/29/2017 1557 Last data filed at 07/29/2017 1538 Gross per 24 hour  Intake 4056.67 ml  Output 2500 ml  Net 1556.67 ml   Filed Weights   07/26/17 2224 07/28/17 0500 07/29/17 0500  Weight: 76.6 kg (168 lb 14 oz) 81.4 kg (179 lb 7.3 oz) 79.6 kg (175 lb 7.8 oz)   REVIEW OF SYSTEMS  As per history otherwise all reviewed and reported negative  Exam:  General exam: Awake alert, oriented, no distress, lying in the bed.  Dry mucous membranes.  Neck is in a brace. Respiratory system:  No increased work of breathing. Cardiovascular system: S1 & S2 heard. No JVD, murmurs, gallops, clicks or pedal edema. Gastrointestinal system:  Abdomen is nondistended, soft and nontender. Normal bowel sounds heard. Central nervous system: Alert and oriented. No focal neurological deficits. Extremities: no CCE.  Data Reviewed: Basic Metabolic Panel: Recent Labs  Lab 07/26/17 1927 07/26/17 2239 07/27/17 0414 07/28/17 0424 07/29/17 0444  NA 121* 121* 119* 123* 124*  K 4.8 4.8 4.3 3.9 3.7  CL 87* 87* 86* 89* 91*    CO2 21* 21* 21* 22 23  GLUCOSE 131* 123* 104* 142* 139*  BUN 22* 21* 20 15 12   CREATININE 1.05 1.05 1.08 0.95 0.89  CALCIUM 8.6* 8.5* 8.1* 8.0* 8.3*   Liver Function Tests: Recent Labs  Lab 07/26/17 1927  AST 27  ALT 15*  ALKPHOS 172*  BILITOT 0.6  PROT 6.4*  ALBUMIN 3.1*   No results for input(s): LIPASE, AMYLASE in the last 168 hours. No results for input(s): AMMONIA in the last 168 hours. CBC: Recent Labs  Lab 07/26/17 1927 07/27/17 0414 07/28/17 0424 07/29/17 0444  WBC 6.5 6.1 5.3 5.9  NEUTROABS 4.6  --   --   --   HGB 9.7* 8.3* 8.8* 9.2*  HCT 29.7* 25.4* 27.0* 28.6*  MCV 83.4 83.3 83.3 83.4  PLT 318 294 310 298   Cardiac Enzymes: Recent Labs  Lab 07/26/17 1927  TROPONINI <0.03   CBG (last 3)  Recent Labs    07/28/17 2123 07/29/17 0802 07/29/17 1157  GLUCAP 144* 151* 197*   Recent Results (from the past 240 hour(s))  MRSA PCR Screening     Status: Abnormal   Collection Time: 07/26/17 10:16 PM  Result Value Ref Range Status   MRSA by PCR POSITIVE (A) NEGATIVE Final    Comment:        The GeneXpert MRSA Assay (FDA approved for NASAL specimens only), is one component of a comprehensive MRSA colonization surveillance program. It is not intended to diagnose MRSA infection nor to guide or monitor treatment for MRSA infections. CRITICAL RESULT CALLED TO, READ BACK BY AND VERIFIED WITH: DANIELS,J @ 0134 ON 07/27/17 BY JUW Performed at [facility], [address] [place], [place] [address]   Culture, blood (routine x 2) Call MD if unable to obtain prior to antibiotics being given     Status: None (Preliminary result)   Collection Time: 07/26/17 10:32 PM  Result Value Ref Range Status   Specimen Description BLOOD RIGHT ARM  Final   Special Requests   Final    BOTTLES DRAWN AEROBIC AND ANAEROBIC Blood Culture adequate volume   Culture   Final    NO GROWTH 3 DAYS Performed at [facility], [address] [place], [place] [address]    Report Status  PENDING  Incomplete  Culture, blood (routine x 2) Call MD if unable to obtain prior to antibiotics being given     Status: None (Preliminary result)   Collection Time: 07/26/17 10:39 PM  Result Value Ref Range Status   Specimen Description BLOOD RIGHT ARM  Final   Special Requests   Final    BOTTLES DRAWN AEROBIC AND ANAEROBIC Blood Culture adequate volume   Culture   Final    NO GROWTH 3 DAYS Performed at [facility], [address] [place], [place] [address]    Report Status PENDING  Incomplete  Culture, Urine     Status: None   Collection Time: 07/26/17 11:24 PM  Result Value Ref Range Status   Specimen Description   Final    URINE, CATHETERIZED Performed at [facility], [address],  [place], [place] [address]    Special Requests   Final    NONE Performed at [facility], [address], [place], [place] [address]    Culture   Final    NO GROWTH Performed at [facility], [address], [place], [place] [address]    Report Status 07/28/2017 FINAL  Final  Culture, blood (Routine X 2) w Reflex to ID Panel     Status: None (Preliminary result)   Collection Time: 07/27/17  7:35 PM  Result Value Ref Range Status   Specimen Description BLOOD LEFT HAND  Final   Special Requests   Final    BOTTLES DRAWN AEROBIC AND ANAEROBIC Blood Culture adequate volume   Culture   Final    NO GROWTH 2 DAYS Performed at [facility], [address] [place], [place] [address]    Report Status PENDING  Incomplete  Culture, blood (Routine X 2) w Reflex to ID Panel     Status: None (Preliminary result)   Collection Time: 07/27/17  7:48 PM  Result Value Ref Range Status   Specimen Description BLOOD RIGHT ARM  Final   Special Requests   Final    BOTTLES DRAWN AEROBIC AND ANAEROBIC Blood Culture adequate volume   Culture   Final    NO GROWTH 2 DAYS Performed at [facility], [address], [place], [place] [address]    Report Status PENDING  Incomplete     Studies: No results  found. Scheduled Meds: . atorvastatin  20 mg Oral q morning - 10a  . Chlorhexidine Gluconate Cloth  6 each Topical Q0600  . doxepin  10 mg Oral QHS  . enoxaparin (LOVENOX) injection  40 mg Subcutaneous Q24H  . famotidine  20 mg Oral BID  . feeding supplement (ENSURE ENLIVE)  237 mL Oral BID BM  . gabapentin  100 mg Oral TID  . insulin aspart  0-15 Units Subcutaneous TID WC  . insulin aspart  0-5 Units Subcutaneous QHS  . levothyroxine  225 mcg Oral QAC breakfast  . lipase/protease/amylase  36,000 Units Oral TID AC  . mupirocin ointment  1 application Nasal BID  . [START ON 07/30/2017] pantoprazole  40 mg Oral Q0600  . sertraline  100 mg Oral BH-q7a  . sulfaSALAzine  1,000 mg Oral BID  . tamsulosin  0.4 mg Oral QPM   Continuous Infusions: . ampicillin-sulbactam (UNASYN) IV Stopped (07/29/17 1608)  . dextrose 5 % and 0.9% NaCl 75 mL/hr at 07/29/17 1000  . vancomycin Stopped (07/29/17 1216)    Principal Problem:   Hyponatremia Active Problems:   HCAP (healthcare-associated pneumonia)   Diabetes mellitus type 2, controlled (HCC)   Hypothyroidism   Essential hypertension   Dehydration   Failure to thrive in adult   History of atrial fibrillation   Hypothermia  Critical Care Time spent: 31 mins  Standley Dakins, MD, FAAFP [facility] Pager [phone]  If 7PM-7AM, please contact night-coverage [email or web address] Password [email or web address] 07/29/2017, 3:57 PM    LOS: 3 days

## 2017-07-30 DIAGNOSIS — T68XXXD Hypothermia, subsequent encounter: Secondary | ICD-10-CM

## 2017-07-30 DIAGNOSIS — E118 Type 2 diabetes mellitus with unspecified complications: Secondary | ICD-10-CM

## 2017-07-30 DIAGNOSIS — E039 Hypothyroidism, unspecified: Secondary | ICD-10-CM

## 2017-07-30 DIAGNOSIS — Z8679 Personal history of other diseases of the circulatory system: Secondary | ICD-10-CM

## 2017-07-30 DIAGNOSIS — I10 Essential (primary) hypertension: Secondary | ICD-10-CM

## 2017-07-30 DIAGNOSIS — E871 Hypo-osmolality and hyponatremia: Secondary | ICD-10-CM

## 2017-07-30 LAB — GLUCOSE, CAPILLARY
GLUCOSE-CAPILLARY: 250 mg/dL — AB (ref 65–99)
GLUCOSE-CAPILLARY: 191 mg/dL — AB (ref 65–99)
GLUCOSE-CAPILLARY: 114 mg/dL — AB (ref 65–99)
Glucose-Capillary: 143 mg/dL — ABNORMAL HIGH (ref 65–99)

## 2017-07-30 LAB — BASIC METABOLIC PANEL
Anion gap: 10 (ref 5–15)
BUN: 10 mg/dL (ref 6–20)
CALCIUM: 8.2 mg/dL — AB (ref 8.9–10.3)
CO2: 22 mmol/L (ref 22–32)
Chloride: 93 mmol/L — ABNORMAL LOW (ref 101–111)
Creatinine, Ser: 0.91 mg/dL (ref 0.61–1.24)
GFR calc Af Amer: >60 mL/min (ref >60)
GLUCOSE: 133 mg/dL — AB (ref 65–99)
Potassium: 3.8 mmol/L (ref 3.5–5.1)
Sodium: 125 mmol/L — ABNORMAL LOW (ref 135–145)

## 2017-07-30 LAB — CBC
HCT: 26.8 % — ABNORMAL LOW (ref 39.0–52.0)
Hemoglobin: 8.5 g/dL — ABNORMAL LOW (ref 13.0–17.0)
MCH: 26.6 pg (ref 26.0–34.0)
MCHC: 31.7 g/dL (ref 30.0–36.0)
MCV: 83.8 fL (ref 78.0–100.0)
Platelets: 257 10*3/uL (ref 150–400)
RBC: 3.2 MIL/uL — ABNORMAL LOW (ref 4.22–5.81)
RDW: 16.5 % — ABNORMAL HIGH (ref 11.5–15.5)
WBC: 5.9 10*3/uL (ref 4.0–10.5)

## 2017-07-30 MED ORDER — METOPROLOL SUCCINATE ER 25 MG PO TB24
12.5000 mg | ORAL_TABLET | Freq: Every day | ORAL | Status: DC
  Administered 2017-07-30 – 2017-08-01 (×3): 12.5 mg via ORAL
  Filled 2017-07-30 (×3): qty 1

## 2017-07-30 MED ORDER — AMOXICILLIN-POT CLAVULANATE 875-125 MG PO TABS: 1.0000 | ORAL_TABLET | Freq: Two times a day (BID) | ORAL | Status: DC

## 2017-07-30 MED ORDER — RAMELTEON 8 MG PO TABS
8.0000 mg | ORAL_TABLET | Freq: Every evening | ORAL | Status: DC | PRN
  Administered 2017-07-31: 8 mg via ORAL
  Filled 2017-07-30 (×2): qty 1

## 2017-07-30 MED ORDER — AMOXICILLIN-POT CLAVULANATE 875-125 MG PO TABS
1.0000 | ORAL_TABLET | Freq: Two times a day (BID) | ORAL | Status: DC
  Administered 2017-07-31 – 2017-08-01 (×3): 1 via ORAL
  Filled 2017-07-30 (×3): qty 1

## 2017-07-30 MED ORDER — FUROSEMIDE 10 MG/ML IJ SOLN
40.0000 mg | Freq: Two times a day (BID) | INTRAMUSCULAR | Status: DC
  Administered 2017-07-30 – 2017-07-31 (×3): 40 mg via INTRAVENOUS
  Filled 2017-07-30 (×4): qty 4

## 2017-07-30 NOTE — Progress Notes (Addendum)
PROGRESS NOTE    Justin Salazar  ZOX:[id number] DOB: [birth year] DOA: 07/26/2017 PCP: Vivien Presto, MD    Brief Narrative:  84 year old male with a history of hypertension, diabetes, hypothyroidism, chronic C3 vertebral fracture status post fall, history of atrial fibrillation not on anticoagulation due to history of falls.  Admitted to the hospital with progressive weakness and diminishing appetite.  Found to be markedly hypo-natremia.  Also concern for developing pneumonia.  He was started on IV fluids and antibiotics.  Clinically he has improved.   Assessment & Plan:   Principal Problem:   Hyponatremia Active Problems:   Diabetes mellitus type 2, controlled (HCC)   Hypothyroidism   Essential hypertension   Dehydration   Failure to thrive in adult   History of atrial fibrillation   HCAP (healthcare-associated pneumonia)   Hypothermia   1. Hyponatremia.  Initially felt to be related to thiazide use as well as some degree of dehydration.  He is been treated with saline with mild improvement of serum sodium.  Clinically at this point, he appears to be hypervolemic.  We will discontinue further IV fluids and start on IV Lasix. 2. Pneumonia.  Recently discharged from the hospital.  Treated with vancomycin and Unasyn.  Clinically appears to be doing well.  Not short of breath, febrile coughing.  Will discontinue IV antibiotics and transition to Augmentin to complete his course. 3. Dehydration.  Improved with IV fluids.  Thiazides have been discontinued. 4. Hypothermia.  Treated with warmed IV fluids and warming blanket.  Temperature is now stable. 5. Hypothyroidism.  TSH elevated at 18.  Patient reported compliance with Synthroid.  His dose was increased to 225 mcg daily.  He will need repeat thyroid function tests in the next 3-4 weeks. 6. Hypertension.  Antihypertensives currently on hold.  Blood pressure stable. 7. Type 2 diabetes.  Oral medications currently on hold.  On sliding  scale insulin.  Blood sugars are stable. 8. Chronic atrial fibrillation.  Heart rate is fairly well controlled.  On metoprolol.  Not on anticoagulation due to fall risk. 9. Chronic C3 vertebral fracture.  Currently in c-collar.  To follow-up with neurosurgery in the coming weeks and will hopefully have c-collar removed.   DVT prophylaxis: Lovenox Code Status: DNR Family Communication: No family present Disposition Plan: Discharge home once improved   Consultants:     Procedures:     Antimicrobials:   Vancomycin 2/3 > 2/6  Unasyn 2/3 > 2/6  Augmentin 2/6 >   Subjective: Denies any shortness of breath.  No chest pain.  Feels well.  No cough.  Objective: Vitals:   07/29/17 2100 07/29/17 2200 07/29/17 2300 07/30/17 0557  BP: 132/67 133/86 (!) 120/95 130/64  Pulse: 73 79 85 80  Resp: 13 (!) 23 14 16   Temp:    (!) 97.3 F (36.3 C)  TempSrc:    Oral  SpO2: 96% 96% 95% 93%  Weight:      Height:        Intake/Output Summary (Last 24 hours) at 07/30/2017 1726 Last data filed at 07/30/2017 1519 Gross per 24 hour  Intake 2598.75 ml  Output 2200 ml  Net 398.75 ml   Filed Weights   07/26/17 2224 07/28/17 0500 07/29/17 0500  Weight: 76.6 kg (168 lb 14 oz) 81.4 kg (179 lb 7.3 oz) 79.6 kg (175 lb 7.8 oz)    Examination:  General exam: Appears calm and comfortable  Neck: C collar in place Respiratory system: diminished breath sounds at  bases. Respiratory effort normal. Cardiovascular system: S1 & S2 heard, RRR. No JVD, murmurs, rubs, gallops or clicks. 1+ pedal edema. Gastrointestinal system: Abdomen is nondistended, soft and nontender. No organomegaly or masses felt. Normal bowel sounds heard. Central nervous system: Alert and oriented. No focal neurological deficits. Extremities: Symmetric 5 x 5 power. Skin: No rashes, lesions or ulcers Psychiatry: Judgement and insight appear normal. Mood & affect appropriate.     Data Reviewed: I have personally reviewed following  labs and imaging studies  CBC: Recent Labs  Lab 07/26/17 1927 07/27/17 0414 07/28/17 0424 07/29/17 0444 07/30/17 0540  WBC 6.5 6.1 5.3 5.9 5.9  NEUTROABS 4.6  --   --   --   --   HGB 9.7* 8.3* 8.8* 9.2* 8.5*  HCT 29.7* 25.4* 27.0* 28.6* 26.8*  MCV 83.4 83.3 83.3 83.4 83.8  PLT 318 294 310 298 257   Basic Metabolic Panel: Recent Labs  Lab 07/26/17 2239 07/27/17 0414 07/28/17 0424 07/29/17 0444 07/30/17 0540  NA 121* 119* 123* 124* 125*  K 4.8 4.3 3.9 3.7 3.8  CL 87* 86* 89* 91* 93*  CO2 21* 21* 22 23 22   GLUCOSE 123* 104* 142* 139* 133*  BUN 21* 20 15 12 10   CREATININE 1.05 1.08 0.95 0.89 0.91  CALCIUM 8.5* 8.1* 8.0* 8.3* 8.2*   GFR: Estimated Creatinine Clearance: 56.1 mL/min (by C-G formula based on SCr of 0.91 mg/dL). Liver Function Tests: Recent Labs  Lab 07/26/17 1927  AST 27  ALT 15*  ALKPHOS 172*  BILITOT 0.6  PROT 6.4*  ALBUMIN 3.1*   No results for input(s): LIPASE, AMYLASE in the last 168 hours. No results for input(s): AMMONIA in the last 168 hours. Coagulation Profile: No results for input(s): INR, PROTIME in the last 168 hours. Cardiac Enzymes: Recent Labs  Lab 07/26/17 1927  TROPONINI <0.03   BNP (last 3 results) No results for input(s): PROBNP in the last 8760 hours. HbA1C: No results for input(s): HGBA1C in the last 72 hours. CBG: Recent Labs  Lab 07/29/17 1630 07/29/17 2126 07/30/17 0752 07/30/17 1155 07/30/17 1655  GLUCAP 164* 98 143* 250* 191*   Lipid Profile: No results for input(s): CHOL, HDL, LDLCALC, TRIG, CHOLHDL, LDLDIRECT in the last 72 hours. Thyroid Function Tests: No results for input(s): TSH, T4TOTAL, FREET4, T3FREE, THYROIDAB in the last 72 hours. Anemia Panel: No results for input(s): VITAMINB12, FOLATE, FERRITIN, TIBC, IRON, RETICCTPCT in the last 72 hours. Sepsis Labs: No results for input(s): PROCALCITON, LATICACIDVEN in the last 168 hours.  Recent Results (from the past 240 hour(s))  MRSA PCR Screening      Status: Abnormal   Collection Time: 07/26/17 10:16 PM  Result Value Ref Range Status   MRSA by PCR POSITIVE (A) NEGATIVE Final    Comment:        The GeneXpert MRSA Assay (FDA approved for NASAL specimens only), is one component of a comprehensive MRSA colonization surveillance program. It is not intended to diagnose MRSA infection nor to guide or monitor treatment for MRSA infections. CRITICAL RESULT CALLED TO, READ BACK BY AND VERIFIED WITH: DANIELS,J @ 0134 ON 07/27/17 BY JUW Performed at [facility], [address] [place], [place] [address]   Culture, blood (routine x 2) Call MD if unable to obtain prior to antibiotics being given     Status: None (Preliminary result)   Collection Time: 07/26/17 10:32 PM  Result Value Ref Range Status   Specimen Description BLOOD RIGHT ARM  Final  Special Requests   Final    BOTTLES DRAWN AEROBIC AND ANAEROBIC Blood Culture adequate volume   Culture   Final    NO GROWTH 4 DAYS Performed at [facility], [address] [place], [place] [address]    Report Status PENDING  Incomplete  Culture, blood (routine x 2) Call MD if unable to obtain prior to antibiotics being given     Status: None (Preliminary result)   Collection Time: 07/26/17 10:39 PM  Result Value Ref Range Status   Specimen Description BLOOD RIGHT ARM  Final   Special Requests   Final    BOTTLES DRAWN AEROBIC AND ANAEROBIC Blood Culture adequate volume   Culture   Final    NO GROWTH 4 DAYS Performed at [facility] [address], [place], [place] [address]    Report Status PENDING  Incomplete  Culture, Urine     Status: None   Collection Time: 07/26/17 11:24 PM  Result Value Ref Range Status   Specimen Description   Final    URINE, CATHETERIZED Performed at [facility], [address], [place], [place] [address]    Special Requests   Final    NONE Performed at [facility], [address] [place], [place] [address]    Culture   Final    NO GROWTH Performed at  [facility], [address], [place], [place] [address]    Report Status 07/28/2017 FINAL  Final  Culture, blood (Routine X 2) w Reflex to ID Panel     Status: None (Preliminary result)   Collection Time: 07/27/17  7:35 PM  Result Value Ref Range Status   Specimen Description BLOOD LEFT HAND  Final   Special Requests   Final    BOTTLES DRAWN AEROBIC AND ANAEROBIC Blood Culture adequate volume   Culture   Final    NO GROWTH 3 DAYS Performed at [facility], [address] [place], [place] [address]    Report Status PENDING  Incomplete  Culture, blood (Routine X 2) w Reflex to ID Panel     Status: None (Preliminary result)   Collection Time: 07/27/17  7:48 PM  Result Value Ref Range Status   Specimen Description BLOOD RIGHT ARM  Final   Special Requests   Final    BOTTLES DRAWN AEROBIC AND ANAEROBIC Blood Culture adequate volume   Culture   Final    NO GROWTH 3 DAYS Performed at [facility], [address], [place], [place] [address]    Report Status PENDING  Incomplete         Radiology Studies: No results found.      Scheduled Meds: . amoxicillin-clavulanate  1 tablet Oral Q12H  . atorvastatin  20 mg Oral q morning - 10a  . Chlorhexidine Gluconate Cloth  6 each Topical Q0600  . doxepin  10 mg Oral QHS  . enoxaparin (LOVENOX) injection  40 mg Subcutaneous Q24H  . famotidine  20 mg Oral BID  . feeding supplement (ENSURE ENLIVE)  237 mL Oral BID BM  . furosemide  40 mg Intravenous BID  . gabapentin  100 mg Oral TID  . insulin aspart  0-15 Units Subcutaneous TID WC  . insulin aspart  0-5 Units Subcutaneous QHS  . levothyroxine  225 mcg Oral QAC breakfast  . lipase/protease/amylase  36,000 Units Oral TID AC  . mupirocin ointment  1 application Nasal BID  . pantoprazole  40 mg Oral Q0600  . sertraline  100 mg Oral BH-q7a  .  sulfaSALAzine  1,000 mg Oral BID  . tamsulosin  0.4 mg Oral QPM   Continuous Infusions:   LOS: 4 days    Time spent:     Erick Blinks, MD [facility] Pager [phone]  If 7PM-7AM, please contact night-coverage [email or web address] Password [facility] 07/30/2017, 5:26 PM

## 2017-07-31 DIAGNOSIS — K5190 Ulcerative colitis, unspecified, without complications: Secondary | ICD-10-CM | POA: Diagnosis present

## 2017-07-31 LAB — BASIC METABOLIC PANEL
Anion gap: 11 (ref 5–15)
BUN: 13 mg/dL (ref 6–20)
CO2: 24 mmol/L (ref 22–32)
Calcium: 8.3 mg/dL — ABNORMAL LOW (ref 8.9–10.3)
Chloride: 92 mmol/L — ABNORMAL LOW (ref 101–111)
Creatinine, Ser: 0.98 mg/dL (ref 0.61–1.24)
GFR calc non Af Amer: >60 mL/min (ref >60)
Glucose, Bld: 98 mg/dL (ref 65–99)
Potassium: 3.7 mmol/L (ref 3.5–5.1)
SODIUM: 127 mmol/L — AB (ref 135–145)

## 2017-07-31 LAB — GLUCOSE, CAPILLARY
GLUCOSE-CAPILLARY: 182 mg/dL — AB (ref 65–99)
GLUCOSE-CAPILLARY: 183 mg/dL — AB (ref 65–99)
Glucose-Capillary: 107 mg/dL — ABNORMAL HIGH (ref 65–99)
Glucose-Capillary: 192 mg/dL — ABNORMAL HIGH (ref 65–99)

## 2017-07-31 LAB — CULTURE, BLOOD (ROUTINE X 2)
CULTURE: NO GROWTH
Culture: NO GROWTH
Special Requests: ADEQUATE
Special Requests: ADEQUATE

## 2017-07-31 LAB — BRAIN NATRIURETIC PEPTIDE: B Natriuretic Peptide: 125 pg/mL — ABNORMAL HIGH (ref 0.0–100.0)

## 2017-07-31 NOTE — Care Management (Addendum)
Patient Information   SS# [id number]  Patient Name Justin Salazar, Justin Salazar ([id number]) Sex Male DOB 07/30/1926  Room Bed  A321 A321-01  Patient Demographics   Address [address] [place] [address] Phone [phone] (Home)  Patient Ethnicity & Race   Ethnic Group Patient Race  Not Hispanic or Latino White or Caucasian  Emergency Contact(s)   Name Relation Home Work Mobile  Baird,Robby Son [phone]    Documents on File    Status Date Received Description  Documents for the Patient  [place] HIPAA NOTICE OF PRIVACY - Scanned Not Received    [facility] E-Signature HIPAA Notice of Privacy Received 05/31/15   Driver's License Received 06/13/15   Insurance Card Not Received    Advance Directives/Living Will/HCPOA/POA Not Received    Other Photo ID Not Received    HIM ROI Authorization  06/13/15 [facility]  HIM ROI Authorization  06/16/15 [facility]  [place] E-Signature HIPAA Notice of Privacy Signed 03/13/17   Documents for the Encounter  AOB (Assignment of Insurance Benefits) Not Received    E-signature AOB Signed 07/26/17   MEDICARE RIGHTS Not Received    E-signature Medicare Rights Signed 07/26/17   EMS Run Sheet Received 07/26/17   ED Patient Billing Extract   ED PB Billing Extract  Cardiac Monitoring Strip Shift Summary Received 07/26/17   Cardiac Monitoring Strip Received 07/27/17   Cardiac Monitoring Strip - Scanned Received 07/30/17   EKG Received 07/28/17   Admission Information   Attending Provider Admitting Provider Admission Type Admission Date/Time  Erick BlinksMemon, Jehanzeb, MD Levie HeritageStinson, Jacob J, DO Emergency 07/26/17 1815  Discharge Date Hospital Service Auth/Cert Status Service Area   Internal Medicine Incomplete [place] SERVICE AREA  Unit Room/Bed Admission Status   AP-DEPT 300 A321/A321-01 Admission (Confirmed)   Admission   Complaint  altered mental status  Hospital Account   Name Acct ID Class Status Primary Coverage  Justin Salazar, Justin Salazar  [id number] Inpatient Open MEDICARE - MEDICARE PART A AND Salazar      Guarantor Account (for Hospital Account [id number])   Name Relation to Pt Service Area Active? Acct Type  Kraynak, Luster Landsbergobert Salazar Self CHSA Yes Personal/Family  Address Phone    [address] [place], [place] [address] [phone](H)        Coverage Information (for Hospital Account [id number])   1. MEDICARE/MEDICARE PART A AND Salazar   F/O Payor/Plan Precert #  MEDICARE/MEDICARE PART A AND Salazar   Subscriber Subscriber #  Justin Salazar, Justin Salazar [id number]  Address Phone  [address] [place], [place] [phone]   2. BLUE CROSS BLUE SHIELD/BCBS SUPPLEMENT   F/O Payor/Plan Precert #  BLUE CROSS BLUE SHIELD/BCBS SUPPLEMENT   Subscriber Subscriber #  Justin Salazar, Justin Salazar [id number]  Address Phone  [address] [place], [place] [phone]

## 2017-07-31 NOTE — Progress Notes (Signed)
PROGRESS NOTE    Justin Salazar  ZOX:[id number] DOB: 06/10/1927 DOA: 07/26/2017 PCP: Vivien Presto, MD    Brief Narrative:  84 year old male with a history of hypertension, diabetes, hypothyroidism, chronic C3 vertebral fracture status post fall, history of atrial fibrillation not on anticoagulation due to history of falls.  Admitted to the hospital with progressive weakness and diminishing appetite.  Found to be markedly hypo-natremia.  Also concern for developing pneumonia.  He was started on IV fluids and antibiotics.  Clinically he has improved.   Assessment & Plan:   Principal Problem:   Hyponatremia Active Problems:   Diabetes mellitus type 2, controlled (HCC)   Hypothyroidism   Essential hypertension   Dehydration   Failure to thrive in adult   History of atrial fibrillation   HCAP (healthcare-associated pneumonia)   Hypothermia   Ulcerative colitis (HCC)   1. Hyponatremia.  Initially felt to be related to thiazide use as well as some degree of dehydration.  He was initially started on saline infusion with some improvement.  Upon reevaluation, he appears to be hypervolemic at this time with lower extremity edema and some crackles on his lung bases.  He is been started on IV Lasix with good urine output.  Serum sodium continues to slowly trend up.  Anticipate another 24 hours of treatment.  Since he does have significant edema, will check BNP as well as echocardiogram. 2. Pneumonia.  Recently discharged from the hospital.  Since there was concern that he may have healthcare associated infection versus aspiration, he was started on vancomycin and Unasyn.  Clinically he has improved.  Antibiotics have been transitioned to Augmentin.  He is not febrile, or short of breath. 3. Dehydration.  Patient received IV fluids and has improved.  Thiazides were discontinued. 4. Hypothermia.  Treated with warmed IV fluids and warming blanket.  Temperature is now stable. 5. Hypothyroidism.   TSH elevated at 18.  Patient reported compliance with Synthroid.  His dose was increased to 225 mcg daily during this admission.  He will need repeat thyroid function tests in the next 3-4 weeks. 6. Hypertension.  Antihypertensives currently on hold.  Blood pressure stable. 7. Type 2 diabetes.  Oral medications currently on hold.  On sliding scale insulin.  Blood sugars are stable. 8. Chronic atrial fibrillation.  Heart rate is fairly well controlled.  On metoprolol.  Not on anticoagulation due to fall risk. 9. Chronic C3 vertebral fracture.  Currently in c-collar.  To follow-up with neurosurgery in the coming weeks and will hopefully have c-collar removed. 10. Anemia.  Current hemoglobin 8.5.  Appears to be near baseline since 04/2017.  No evidence of bleeding. 11. Failure to thrive.  After long discussion with the son, it was reported that patient has been not significantly eating or drinking.  He often gets confused and agitated.  Although he does not have a formal diagnosis of dementia, this certainly may be a possibility.  He has had multiple admissions for dehydration and failure to thrive.  Family is concerned that once he returns home he will run a similar course of dehydration and failure to thrive.  They are inquiring about hospice services.  I think an outpatient consultation with hospice would be completely appropriate.   DVT prophylaxis: Lovenox Code Status: DNR Family Communication: Discussed with son over the phone. Disposition Plan: Physical therapy has recommended skilled nursing facility.  Both patient and family desire to take the patient home.  His son has expressed interest in having  an outpatient hospice evaluation   Consultants:     Procedures:     Antimicrobials:   Vancomycin 2/3 > 2/6  Unasyn 2/3 > 2/6  Augmentin 2/6 >   Subjective: Denies any shortness of breath, cough.  No vomiting or diarrhea.  Objective: Vitals:   07/30/17 2004 07/30/17 2227 07/31/17  0444 07/31/17 1338  BP: (!) 118/52  117/71 121/74  Pulse: 75  61 64  Resp: 16  16 18   Temp: 98 F (36.7 C)  98 F (36.7 C) 98.2 F (36.8 C)  TempSrc: Oral  Oral Oral  SpO2: 96% 95% 95% 96%  Weight:   75 kg (165 lb 5.5 oz)   Height:        Intake/Output Summary (Last 24 hours) at 07/31/2017 1732 Last data filed at 07/31/2017 1300 Gross per 24 hour  Intake 1163.75 ml  Output 2675 ml  Net -1511.25 ml   Filed Weights   07/28/17 0500 07/29/17 0500 07/31/17 0444  Weight: 81.4 kg (179 lb 7.3 oz) 79.6 kg (175 lb 7.8 oz) 75 kg (165 lb 5.5 oz)    Examination:  General exam: Alert, awake, oriented x 3 Neck: C-collar in place Respiratory system: Crackles at bases with diminished breath sounds. Respiratory effort normal. Cardiovascular system:RRR. No murmurs, rubs, gallops.  1+ edema bilaterally Gastrointestinal system: Abdomen is nondistended, soft and nontender. No organomegaly or masses felt. Normal bowel sounds heard. Central nervous system: Alert and oriented. No focal neurological deficits. Extremities: No C/C, +pedal pulses Skin: No rashes, lesions or ulcers Psychiatry: Judgement and insight appear normal. Mood & affect appropriate.   Data Reviewed: I have personally reviewed following labs and imaging studies  CBC: Recent Labs  Lab 07/26/17 1927 07/27/17 0414 07/28/17 0424 07/29/17 0444 07/30/17 0540  WBC 6.5 6.1 5.3 5.9 5.9  NEUTROABS 4.6  --   --   --   --   HGB 9.7* 8.3* 8.8* 9.2* 8.5*  HCT 29.7* 25.4* 27.0* 28.6* 26.8*  MCV 83.4 83.3 83.3 83.4 83.8  PLT 318 294 310 298 257   Basic Metabolic Panel: Recent Labs  Lab 07/27/17 0414 07/28/17 0424 07/29/17 0444 07/30/17 0540 07/31/17 0602  NA 119* 123* 124* 125* 127*  K 4.3 3.9 3.7 3.8 3.7  CL 86* 89* 91* 93* 92*  CO2 21* 22 23 22 24   GLUCOSE 104* 142* 139* 133* 98  BUN 20 15 12 10 13   CREATININE 1.08 0.95 0.89 0.91 0.98  CALCIUM 8.1* 8.0* 8.3* 8.2* 8.3*   GFR: Estimated Creatinine Clearance: 52.1 mL/min  (by C-G formula based on SCr of 0.98 mg/dL). Liver Function Tests: Recent Labs  Lab 07/26/17 1927  AST 27  ALT 15*  ALKPHOS 172*  BILITOT 0.6  PROT 6.4*  ALBUMIN 3.1*   No results for input(s): LIPASE, AMYLASE in the last 168 hours. No results for input(s): AMMONIA in the last 168 hours. Coagulation Profile: No results for input(s): INR, PROTIME in the last 168 hours. Cardiac Enzymes: Recent Labs  Lab 07/26/17 1927  TROPONINI <0.03   BNP (last 3 results) No results for input(s): PROBNP in the last 8760 hours. HbA1C: No results for input(s): HGBA1C in the last 72 hours. CBG: Recent Labs  Lab 07/30/17 1655 07/30/17 2131 07/31/17 0746 07/31/17 1102 07/31/17 1638  GLUCAP 191* 114* 107* 192* 182*   Lipid Profile: No results for input(s): CHOL, HDL, LDLCALC, TRIG, CHOLHDL, LDLDIRECT in the last 72 hours. Thyroid Function Tests: No results for input(s): TSH, T4TOTAL, FREET4, T3FREE, THYROIDAB  in the last 72 hours. Anemia Panel: No results for input(s): VITAMINB12, FOLATE, FERRITIN, TIBC, IRON, RETICCTPCT in the last 72 hours. Sepsis Labs: No results for input(s): PROCALCITON, LATICACIDVEN in the last 168 hours.  Recent Results (from the past 240 hour(s))  MRSA PCR Screening     Status: Abnormal   Collection Time: 07/26/17 10:16 PM  Result Value Ref Range Status   MRSA by PCR POSITIVE (A) NEGATIVE Final    Comment:        The GeneXpert MRSA Assay (FDA approved for NASAL specimens only), is one component of a comprehensive MRSA colonization surveillance program. It is not intended to diagnose MRSA infection nor to guide or monitor treatment for MRSA infections. CRITICAL RESULT CALLED TO, READ BACK BY AND VERIFIED WITH: DANIELS,J @ 0134 ON 07/27/17 BY JUW Performed at [facility], [address] [place], [place] [address]   Culture, blood (routine x 2) Call MD if unable to obtain prior to antibiotics being given     Status: None   Collection Time: 07/26/17 10:32  PM  Result Value Ref Range Status   Specimen Description BLOOD RIGHT ARM  Final   Special Requests   Final    BOTTLES DRAWN AEROBIC AND ANAEROBIC Blood Culture adequate volume   Culture   Final    NO GROWTH 5 DAYS Performed at [facility], [address] [place], [place] [address]    Report Status 07/31/2017 FINAL  Final  Culture, blood (routine x 2) Call MD if unable to obtain prior to antibiotics being given     Status: None   Collection Time: 07/26/17 10:39 PM  Result Value Ref Range Status   Specimen Description BLOOD RIGHT ARM  Final   Special Requests   Final    BOTTLES DRAWN AEROBIC AND ANAEROBIC Blood Culture adequate volume   Culture   Final    NO GROWTH 5 DAYS Performed at [facility], [address] [place], [place] [address]    Report Status 07/31/2017 FINAL  Final  Culture, Urine     Status: None   Collection Time: 07/26/17 11:24 PM  Result Value Ref Range Status   Specimen Description   Final    URINE, CATHETERIZED Performed at [facility], [address] [place], [place] [address]    Special Requests   Final    NONE Performed at [facility], [address] [place], [place] [address]    Culture   Final    NO GROWTH Performed at [facility], [address] [place], [place] [address]    Report Status 07/28/2017 FINAL  Final  Culture, blood (Routine X 2) w Reflex to ID Panel     Status: None (Preliminary result)   Collection Time: 07/27/17  7:35 PM  Result Value Ref Range Status   Specimen Description BLOOD LEFT HAND  Final   Special Requests   Final    BOTTLES DRAWN AEROBIC AND ANAEROBIC Blood Culture adequate volume   Culture   Final    NO GROWTH 4 DAYS Performed at [facility], [address] [place], [place] [address]    Report Status PENDING  Incomplete  Culture, blood (Routine X 2) w Reflex to ID Panel     Status: None (Preliminary result)   Collection Time: 07/27/17  7:48 PM  Result Value Ref Range Status   Specimen Description BLOOD  RIGHT ARM  Final   Special Requests   Final    BOTTLES DRAWN AEROBIC AND ANAEROBIC  Blood Culture adequate volume   Culture   Final    NO GROWTH 4 DAYS Performed at [facility], [address] [place], [place] [address]    Report Status PENDING  Incomplete         Radiology Studies: No results found.      Scheduled Meds: . amoxicillin-clavulanate  1 tablet Oral Q12H  . atorvastatin  20 mg Oral q morning - 10a  . Chlorhexidine Gluconate Cloth  6 each Topical Q0600  . doxepin  10 mg Oral QHS  . enoxaparin (LOVENOX) injection  40 mg Subcutaneous Q24H  . famotidine  20 mg Oral BID  . feeding supplement (ENSURE ENLIVE)  237 mL Oral BID BM  . furosemide  40 mg Intravenous BID  . gabapentin  100 mg Oral TID  . insulin aspart  0-15 Units Subcutaneous TID WC  . insulin aspart  0-5 Units Subcutaneous QHS  . levothyroxine  225 mcg Oral QAC breakfast  . lipase/protease/amylase  36,000 Units Oral TID AC  . metoprolol succinate  12.5 mg Oral Daily  . mupirocin ointment  1 application Nasal BID  . pantoprazole  40 mg Oral Q0600  . sertraline  100 mg Oral BH-q7a  . sulfaSALAzine  1,000 mg Oral BID  . tamsulosin  0.4 mg Oral QPM   Continuous Infusions:   LOS: 5 days    Time spent: 30 minutes    Erick Blinks, MD [facility] Pager [phone]  If 7PM-7AM, please contact night-coverage [email or web address] Password [facility] 07/31/2017, 5:32 PM

## 2017-07-31 NOTE — Progress Notes (Signed)
Physical Therapy Treatment Patient Details Name: Justin Salazar MRN: [id number] DOB: [birth year] Today's Date: 07/31/2017    History of Present Illness Justin Salazar is a 84 y.o. male with a history of chronic C3 vertebral fracture, hypertension, GERD, diabetes type 2, hypothyroidism, coronary artery disease, history of atrial fibrillation not on anticoagulation secondary to falls.  Patient recently hospitalized at an outside hospital due to a candidal UTI.  Patient just completed 10 days of fluconazole.  Patient has been becoming progressively weak over the past 3 days with diminishing appetite.  Patient has had little to eat or drink for the past 3 days and is noticed increasing confusion over that time period.  No palliating or provoking factors.  Symptoms are worsening.  Denies fevers, chills, nausea, vomiting.  Denies chest pain.  Does have occasional dry cough and has not had a bowel movement over the past 6 days    PT Comments    Patient tolerated sitting up at bedside for approximately 20 minutes while performing BLE ROM exercises, had difficulty maintaining sitting balance with frequent leaning to the right and unable to come to complete stand due to BLE weakness/poor standing balance.  Patient will benefit from continued physical therapy in hospital and recommended venue below to increase strength, balance, endurance for safe ADLs and gait.    Follow Up Recommendations  SNF;Supervision/Assistance - 24 hour     Equipment Recommendations  None recommended by PT    Recommendations for Other Services       Precautions / Restrictions Precautions Precautions: Fall Required Braces or Orthoses: Cervical Brace Cervical Brace: Hard collar;At all times Restrictions Weight Bearing Restrictions: No    Mobility  Bed Mobility Overal bed mobility: Needs Assistance Bed Mobility: Supine to Sit;Sit to Supine     Supine to sit: Mod assist Sit to supine: Mod assist       Transfers Overall transfer level: Needs assistance Equipment used: Rolling walker (2 wheeled) Transfers: Sit to/from Stand Sit to Stand: Max assist         General transfer comment: unable to tranfer to chair due to weakness  Ambulation/Gait                 Stairs            Wheelchair Mobility    Modified Rankin (Stroke Patients Only)       Balance Overall balance assessment: Needs assistance Sitting-balance support: Bilateral upper extremity supported;Feet supported Sitting balance-Leahy Scale: Fair Sitting balance - Comments: fair/poor Postural control: Right lateral lean Standing balance support: Bilateral upper extremity supported;During functional activity Standing balance-Leahy Scale: Poor Standing balance comment: poor with RW                            Cognition Arousal/Alertness: Awake/alert Behavior During Therapy: WFL for tasks assessed/performed Overall Cognitive Status: Within Functional Limits for tasks assessed                                        Exercises General Exercises - Lower Extremity Ankle Circles/Pumps: Seated;AROM;Strengthening;Both;15 reps Long Arc Quad: Seated;AROM;Strengthening;Both;10 reps Hip Flexion/Marching: Seated;AROM;Strengthening;Both;10 reps    General Comments        Pertinent Vitals/Pain Pain Assessment: No/denies pain    Home Living  Prior Function            PT Goals (current goals can now be found in the care plan section) Acute Rehab PT Goals Patient Stated Goal: return home PT Goal Formulation: With patient Time For Goal Achievement: 07/31/17 Potential to Achieve Goals: Fair Progress towards PT goals: Not progressing toward goals - comment(patient limited secondary to generalized weakness and poor standing balance)    Frequency    Min 3X/week      PT Plan Current plan remains appropriate    Co-evaluation               AM-PAC PT "6 Clicks" Daily Activity  Outcome Measure  Difficulty turning over in bed (including adjusting bedclothes, sheets and blankets)?: A Little Difficulty moving from lying on back to sitting on the side of the bed? : A Lot Difficulty sitting down on and standing up from a chair with arms (e.g., wheelchair, bedside commode, etc,.)?: Unable Help needed moving to and from a bed to chair (including a wheelchair)?: Total Help needed walking in hospital room?: Total Help needed climbing 3-5 steps with a railing? : Total 6 Click Score: 9    End of Session   Activity Tolerance: Patient tolerated treatment well;Patient limited by fatigue Patient left: in bed;with call bell/phone within reach Nurse Communication: Mobility status PT Visit Diagnosis: Unsteadiness on feet (R26.81);Other abnormalities of gait and mobility (R26.89);Muscle weakness (generalized) (M62.81)     Time: [id number] PT Time Calculation (min) (ACUTE ONLY): 29 min  Charges:  $Therapeutic Exercise: 8-22 mins $Therapeutic Activity: 8-22 mins                    G Codes:       2:37 PM, 07/31/17 Ocie BobJames Genisis Sonnier, MPT Physical Therapist with [facility] [phone] office [phone] mobile phone

## 2017-07-31 NOTE — Care Management Important Message (Signed)
Important Message  Patient Details  Name: Justin LisRobert B Salazar MRN: [id number] Date of Birth: 12/08/1926   Medicare Important Message Given:  Yes    Malcolm MetroChildress, [name], RN 07/31/2017, 4:37 PM

## 2017-07-31 NOTE — Care Management Note (Signed)
Case Management Note  Patient Details  Name: Justin Salazar MRN: [id number] Date of Birth: 11/26/1926  Subjective/Objective:          Admitted with hyponatremia. Pt from home, has family caregivers, 24/7. Has WC, walker, cane, bsc, hospital bed. Pt and family want pt to return home at DC. Active with [facility] pta. Family interested in Hospice services.          Action/Plan: Pt will DC home tomorrow 08/01/17. [facility] rep, Justin Salazar, aware, will pull orders from chart. Son aware HH has 48 hrs to resume services.  Referral will be made to [facility] (son's [facility] to make contact and establish care [facility].  Pt will need EMS transport. Son prefers transport home around lunchtime if possible.   Expected Discharge Date:      08/01/17            Expected Discharge Plan:  Home w Home Health Services  In-House Referral:  Clinical Social Work  Discharge planning Services  CM Consult  Post Acute Care Choice:  Home Health, Resumption of Svcs/PTA Provider Choice offered to:  Adult Children  HH Arranged:   RN/PT HH Agency:  [facility]  Status of Service:  In process, will continue to follow  If discussed at Long Length of Stay Meetings, dates discussed:  07/31/17  Additional Comments:  Justin Salazar, Justin Tyminski Demske, RN 07/31/2017, 4:31 PM

## 2017-07-31 NOTE — Clinical Social Work Note (Signed)
Clinical Social Work Assessment  Patient Details  Name: Justin Salazar MRN: [id number] Date of Birth: 10/19/1926  Date of referral:  07/31/17               Reason for consult:  Facility Placement                Permission sought to share information with:  Family Supports Permission granted to share information::  Yes, Verbal Permission Granted  Name::     Robby Ice  Agency::     Relationship::     Contact Information:     Housing/Transportation Living arrangements for the past 2 months:  [place] of Information:  Patient, Adult Children Patient Interpreter Needed:  None Criminal Activity/Legal Involvement Pertinent to Current Situation/Hospitalization:  No - Comment as needed Significant Relationships:  Adult Children Lives with:  Adult Children Do you feel safe going back to the place where you live?  Yes Need for family participation in patient care:  Yes (Comment)  Care giving concerns: Pt's son wondering if pt would be appropriate for hospice at home.   Social Worker assessment / plan: Pt referred to CSW for SNF placement. Met with pt this AM. Pt indicates he wants to go home where his family takes care of him. He does not want SNF. Spoke with pt's son, Cherlynn Kaiser, by phone. Pt lives with Robby and [place] wife. Pt's daughter in law and pt's grand daughter take full time care of pt at home. Per Robby, pt has been in a SNF three times and he has fallen and ended up back in the hospital all three times. They do not feel SNF care is a good option for pt.   Robby states that they have had PT and OT from [facility] and they are quite pleased with this care.   Robby did inquire about whether pt is appropriate for hospice care at home. He wonders if he is being cruel to pt by continuously "forcing" him to eat and hydrate when pt does not want to. Per Robby, pt has no appetite and doesn't want to drink water so they have to constantly almost beg him to do so.    Information and support offered to pt's son. Updated MD. Will ask RN CM to contact pt's son to further discuss dc planning. Pt will need ambulance transport home per son.  Employment status:  Retired Forensic scientist:  [employer] PT Recommendations:  Research officer, trade union, [place] / Referral to community resources:     Patient/Family's Response to care: Pt and family accepting and appreciative of care.   Patient/Family's Understanding of and Emotional Response to Diagnosis, Current Treatment, and Prognosis: Pt's son appears to have a realistic understanding of pt's diagnosis. Emotional support offered and questions answered.  Emotional Assessment Appearance:  Appears stated age Attitude/Demeanor/Rapport:  Engaged, Gracious Affect (typically observed):  Accepting, Pleasant Orientation:  Oriented to Self, Oriented to Place Alcohol / Substance use:  Not Applicable Psych involvement (Current and /or in the community):  No (Comment)  Discharge Needs  Concerns to be addressed:  Discharge Planning Concerns Readmission within the last 30 days:  No Current discharge risk:  Physical Impairment Barriers to Discharge:  No Barriers Identified   Shade Flood, LCSW 07/31/2017, 11:30 AM

## 2017-08-01 ENCOUNTER — Inpatient Hospital Stay (HOSPITAL_COMMUNITY): Payer: Medicare Other

## 2017-08-01 DIAGNOSIS — E860 Dehydration: Secondary | ICD-10-CM

## 2017-08-01 DIAGNOSIS — J189 Pneumonia, unspecified organism: Secondary | ICD-10-CM

## 2017-08-01 DIAGNOSIS — K51919 Ulcerative colitis, unspecified with unspecified complications: Secondary | ICD-10-CM

## 2017-08-01 LAB — CULTURE, BLOOD (ROUTINE X 2)
CULTURE: NO GROWTH
Culture: NO GROWTH
SPECIAL REQUESTS: ADEQUATE
Special Requests: ADEQUATE

## 2017-08-01 LAB — BASIC METABOLIC PANEL
ANION GAP: 10 (ref 5–15)
BUN: 16 mg/dL (ref 6–20)
CALCIUM: 8.2 mg/dL — AB (ref 8.9–10.3)
CO2: 27 mmol/L (ref 22–32)
CREATININE: 1.13 mg/dL (ref 0.61–1.24)
Chloride: 89 mmol/L — ABNORMAL LOW (ref 101–111)
GFR calc Af Amer: >60 mL/min (ref >60)
GFR, EST NON AFRICAN AMERICAN: 56 mL/min — AB (ref >60)
Glucose, Bld: 144 mg/dL — ABNORMAL HIGH (ref 65–99)
Potassium: 3.3 mmol/L — ABNORMAL LOW (ref 3.5–5.1)
Sodium: 126 mmol/L — ABNORMAL LOW (ref 135–145)

## 2017-08-01 LAB — GLUCOSE, CAPILLARY
Glucose-Capillary: 140 mg/dL — ABNORMAL HIGH (ref 65–99)
Glucose-Capillary: 209 mg/dL — ABNORMAL HIGH (ref 65–99)

## 2017-08-01 MED ORDER — SODIUM CHLORIDE 1 G PO TABS
2.0000 g | ORAL_TABLET | Freq: Two times a day (BID) | ORAL | Status: DC
  Administered 2017-08-01: 2 g via ORAL
  Filled 2017-08-01 (×8): qty 2

## 2017-08-01 NOTE — Progress Notes (Signed)
Pt's IV catheter removed and intact. Pt's IV site clean dry and intact. Discharge instructions reviewed and discussed with patient's son via telephone. All questions were answered and no further questions at this time. Pt in stable condition and in no acute distress at time of discharge. Pt transported by EMS.

## 2017-08-01 NOTE — Discharge Summary (Addendum)
Physician Discharge Summary  Justin Salazar:[id number] DOB: 1926/10/05 DOA: 07/26/2017  PCP: Vivien Presto, MD  Admit date: 07/26/2017 Discharge date: 08/01/2017  Admitted From: Home Disposition: Home with hospice services  Recommendations for Outpatient Follow-up:  1. Patient will discharge home with hospice services  Home Health: Equipment/Devices:  Discharge Condition: Stable CODE STATUS: DNR, comfort measures Diet recommendation: Heart Healthy / Carb Modified   Brief/Interim Summary: 84 year old male with a history of hypertension, diabetes, hypothyroidism, chronic C3 vertebral fracture status post fall, history of atrial fibrillation not on anticoagulation due to history of falls.  Admitted to the hospital with progressive weakness and diminishing appetite.  Found to be markedly hypo-natremia.  Also concern for developing pneumonia.  He was started on IV fluids and antibiotics.   Discharge Diagnoses:  Principal Problem:   Hyponatremia Active Problems:   Diabetes mellitus type 2, controlled (HCC)   Hypothyroidism   Essential hypertension   Dehydration   Failure to thrive in adult   History of atrial fibrillation   HCAP (healthcare-associated pneumonia)   Hypothermia   Ulcerative colitis (HCC)   1. Hyponatremia.  Initially felt to be related to thiazide use as well as some degree of dehydration.  He was initially started on saline infusion with some improvement.  Upon reevaluation, he appeared to have become hypervolemic with evidence of anasarca and some crackles on his lung bases.    He was treated with intravenous Lasix with good urine output.  Serum sodium has remained relatively stable.  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  Clinically, he is not appear to be symptomatic.  He is not confused, with no neurologic deficits.  He does not complain of generalized weakness at this time.  Considering goals of care, we will not pursue further workup/treatment. 2. Pneumonia.   Recently discharged from the hospital.  Since there was concern that he may have healthcare associated infection versus aspiration, he was started on vancomycin and Unasyn.   With IV antibiotics he clinically improved.  Antibiotics were transitioned to Augmentin and he completed his course in the hospital.  He is not febrile, or short of breath at the time of discharge. 3. Dehydration.  Patient received IV fluids and has improved.  Thiazides have been discontinued. 4. Hypothermia.  Treated with warmed IV fluids and warming blanket.  Temperature is now stable. 5. Hypothyroidism.  TSH elevated at 18.  Patient reported compliance with Synthroid.  His dose was increased to 225 mcg daily during this admission.  He will need repeat thyroid function tests in the next 3-4 weeks. 6. Hypertension.    Currently on metoprolol.  Holding hydrochlorothiazide/losartan/amlodipine since he is normotensive. 7. Type 2 diabetes.    Resume oral medications on discharge.    Blood sugars have been stable. 8. Chronic atrial fibrillation.  Heart rate is fairly well controlled.  On metoprolol.  Not on anticoagulation due to fall risk. 9. Chronic C3 vertebral fracture.  Currently in c-collar.    He has been wearing this since his injury in 02/2017 10. Anemia.  Current hemoglobin 8.5.  Appears to be near baseline since 04/2017.  No evidence of bleeding. 11. History of urinary retention with chronic indwelling Foley catheter.  Continue on tamsulosin and catheter. 12. Failure to thrive.  After long discussion with the son, it was reported that patient has been not significantly eating or drinking.  He often gets confused and agitated.  Although he does not have a formal diagnosis of dementia, this certainly may be  a possibility.    His lack of p.o. intake has led to multiple admissions for dehydration and failure to thrive.  Family is concerned that once he returns home he will run a similar course of dehydration and failure to thrive.   They want to enroll the patient in hospice services and focus on quality of life/comfort.  Patient is not interested in skilled nursing facility placement or pursuing aggressive treatments.  He has been referred to hospice who will follow the patient at home.  Goal of therapy should be quality of life and comfort.  He will be discharged home with hospice services.    Discharge Instructions  Discharge Instructions    Diet - low sodium heart healthy   Complete by:  As directed    Increase activity slowly   Complete by:  As directed      Allergies as of 08/01/2017   No Known Allergies     Medication List    STOP taking these medications   amLODipine 10 MG tablet Commonly known as:  NORVASC   atorvastatin 20 MG tablet Commonly known as:  LIPITOR   b complex vitamins tablet   CALCIUM-MAGNESIUM-ZINC PO   doxycycline 100 MG tablet Commonly known as:  VIBRA-TABS   fluconazole 200 MG tablet Commonly known as:  DIFLUCAN   losartan-hydrochlorothiazide 100-25 MG tablet Commonly known as:  HYZAAR   Melatonin 10 MG Tabs   MUSCLE RUB EX   Vitamin D3 1000 units Caps     TAKE these medications   doxepin 10 MG capsule Commonly known as:  SINEQUAN Take 10 mg by mouth at bedtime.   gabapentin 100 MG capsule Commonly known as:  NEURONTIN Take 100 mg by mouth 3 (three) times daily.   levothyroxine 175 MCG tablet Commonly known as:  SYNTHROID, LEVOTHROID Take 175 mcg by mouth daily before breakfast.   lipase/protease/amylase [address] units Cpep capsule Commonly known as:  CREON Take 36,000 Units by mouth 3 (three) times daily before meals.   metFORMIN 1000 MG tablet Commonly known as:  GLUCOPHAGE Take 1,000 mg by mouth 2 (two) times daily with a meal.   metoprolol succinate 25 MG 24 hr tablet Commonly known as:  TOPROL-XL Take 12.5 mg by mouth every evening.   oxybutynin 10 MG 24 hr tablet Commonly known as:  DITROPAN-XL Take 1 tablet (10 mg total) at bedtime by mouth.    ranitidine 150 MG tablet Commonly known as:  ZANTAC Take 150 mg by mouth 2 (two) times daily.   sertraline 100 MG tablet Commonly known as:  ZOLOFT Take 100 mg every morning by mouth.   sitaGLIPtin 100 MG tablet Commonly known as:  JANUVIA Take 100 mg at bedtime by mouth.   sulfaSALAzine 500 MG tablet Commonly known as:  AZULFIDINE Take 1,000 mg by mouth 2 (two) times daily.   tamsulosin 0.4 MG Caps capsule Commonly known as:  FLOMAX Take 0.4 mg by mouth every evening.   vitamin B-12 1000 MCG tablet Commonly known as:  CYANOCOBALAMIN Take 1,000 mcg daily by mouth.      Follow-up Information    [facility] up.   Specialty:  Home Health Services Contact information: [address] [place] [address] [phone]          No Known Allergies  Consultations:     Procedures/Studies: Dg Chest 2 View  Result Date: 07/26/2017 CLINICAL DATA:  84 year old with chronic indwelling Foley catheter, personal history of C3 fracture in September, 2018, presenting  with intermittent confusion and generalized weakness which began this week. EXAM: CHEST  2 VIEW COMPARISON:  Chest x-ray and CT chest 02/21/2017. FINDINGS: AP erect and lateral images were obtained. Suboptimal inspiration. Cardiac silhouette mildly to moderately enlarged, unchanged. Thoracic aorta atherosclerotic, unchanged. Hilar and mediastinal contours otherwise unremarkable. Chronic elevation of the right hemidiaphragm with chronic scar/atelectasis involving the right lower lobe and right middle lobe. Airspace consolidation with air bronchograms involving the medial left lower lobe. Lungs otherwise clear. Pulmonary vascularity normal. Possible small left pleural effusion. IMPRESSION: 1. Suboptimal inspiration. Acute left lower lobe pneumonia. Possible small left pleural effusion. 2. Chronic elevation the right hemidiaphragm with chronic scar/atelectasis involving the right lower lobe and right  middle lobe. 3. Stable mild-to-moderate cardiomegaly. Pulmonary venous hypertension without overt edema. Electronically Signed   By: Hulan Saashomas  Lawrence M.D.   On: 07/26/2017 19:20      Subjective: No shortness of breath, no chest pain, no cough.  Discharge Exam: Vitals:   08/01/17 0512 08/01/17 0843  BP: 116/60 130/66  Pulse: 74 75  Resp:    Temp: 97.6 F (36.4 C)   SpO2: 94%    Vitals:   07/31/17 1338 07/31/17 1900 08/01/17 0512 08/01/17 0843  BP: 121/74 127/63 116/60 130/66  Pulse: 64 73 74 75  Resp: 18 18    Temp: 98.2 F (36.8 C) 98.3 F (36.8 C) 97.6 F (36.4 C)   TempSrc: Oral Oral Oral   SpO2: 96% 95% 94%   Weight:      Height:        General: Pt is alert, awake, not in acute distress Cardiovascular: RRR, S1/S2 +, no rubs, no gallops Respiratory: CTA bilaterally, no wheezing, no rhonchi Abdominal: Soft, NT, ND, bowel sounds + Extremities: 1+ edema, no cyanosis    The results of significant diagnostics from this hospitalization (including imaging, microbiology, ancillary and laboratory) are listed below for reference.     Microbiology: Recent Results (from the past 240 hour(s))  MRSA PCR Screening     Status: Abnormal   Collection Time: 07/26/17 10:16 PM  Result Value Ref Range Status   MRSA by PCR POSITIVE (A) NEGATIVE Final    Comment:        The GeneXpert MRSA Assay (FDA approved for NASAL specimens only), is one component of a comprehensive MRSA colonization surveillance program. It is not intended to diagnose MRSA infection nor to guide or monitor treatment for MRSA infections. CRITICAL RESULT CALLED TO, READ BACK BY AND VERIFIED WITH: DANIELS,J @ 0134 ON 07/27/17 BY JUW Performed at [facility], [address] [place], [place] [address]   Culture, blood (routine x 2) Call MD if unable to obtain prior to antibiotics being given     Status: None   Collection Time: 07/26/17 10:32 PM  Result Value Ref Range Status   Specimen Description BLOOD  RIGHT ARM  Final   Special Requests   Final    BOTTLES DRAWN AEROBIC AND ANAEROBIC Blood Culture adequate volume   Culture   Final    NO GROWTH 5 DAYS Performed at [facility], [address] [place], [place] [address]    Report Status 07/31/2017 FINAL  Final  Culture, blood (routine x 2) Call MD if unable to obtain prior to antibiotics being given     Status: None   Collection Time: 07/26/17 10:39 PM  Result Value Ref Range Status   Specimen Description BLOOD RIGHT ARM  Final   Special Requests   Final  BOTTLES DRAWN AEROBIC AND ANAEROBIC Blood Culture adequate volume   Culture   Final    NO GROWTH 5 DAYS Performed at [facility], [address] [place], [place] [address]    Report Status 07/31/2017 FINAL  Final  Culture, Urine     Status: None   Collection Time: 07/26/17 11:24 PM  Result Value Ref Range Status   Specimen Description   Final    URINE, CATHETERIZED Performed at [facility], [address], [place], [place] [address]    Special Requests   Final    NONE Performed at [facility], [address] [place], [place] [address]    Culture   Final    NO GROWTH Performed at [facility], [address], [place], [place] [address]    Report Status 07/28/2017 FINAL  Final  Culture, blood (Routine X 2) w Reflex to ID Panel     Status: None   Collection Time: 07/27/17  7:35 PM  Result Value Ref Range Status   Specimen Description BLOOD LEFT HAND  Final   Special Requests   Final    BOTTLES DRAWN AEROBIC AND ANAEROBIC Blood Culture adequate volume   Culture   Final    NO GROWTH 5 DAYS Performed at [facility], [address] [place], [place] [address]    Report Status 08/01/2017 FINAL  Final  Culture, blood (Routine X 2) w Reflex to ID Panel     Status: None   Collection Time: 07/27/17  7:48 PM  Result Value Ref Range Status   Specimen Description BLOOD RIGHT ARM  Final   Special Requests   Final    BOTTLES DRAWN AEROBIC AND ANAEROBIC Blood Culture  adequate volume   Culture   Final    NO GROWTH 5 DAYS Performed at [facility], [address], [place], [place] [address]    Report Status 08/01/2017 FINAL  Final     Labs: BNP (last 3 results) Recent Labs    07/31/17 1735  BNP 125.0*   Basic Metabolic Panel: Recent Labs  Lab 07/28/17 0424 07/29/17 0444 07/30/17 0540 07/31/17 0602 08/01/17 0623  NA 123* 124* 125* 127* 126*  K 3.9 3.7 3.8 3.7 3.3*  CL 89* 91* 93* 92* 89*  CO2 22 23 22 24 27   GLUCOSE 142* 139* 133* 98 144*  BUN 15 12 10 13 16   CREATININE 0.95 0.89 0.91 0.98 1.13  CALCIUM 8.0* 8.3* 8.2* 8.3* 8.2*   Liver Function Tests: Recent Labs  Lab 07/26/17 1927  AST 27  ALT 15*  ALKPHOS 172*  BILITOT 0.6  PROT 6.4*  ALBUMIN 3.1*   No results for input(s): LIPASE, AMYLASE in the last 168 hours. No results for input(s): AMMONIA in the last 168 hours. CBC: Recent Labs  Lab 07/26/17 1927 07/27/17 0414 07/28/17 0424 07/29/17 0444 07/30/17 0540  WBC 6.5 6.1 5.3 5.9 5.9  NEUTROABS 4.6  --   --   --   --   HGB 9.7* 8.3* 8.8* 9.2* 8.5*  HCT 29.7* 25.4* 27.0* 28.6* 26.8*  MCV 83.4 83.3 83.3 83.4 83.8  PLT 318 294 310 298 257   Cardiac Enzymes: Recent Labs  Lab 07/26/17 1927  TROPONINI <0.03   BNP: Invalid input(s): POCBNP CBG: Recent Labs  Lab 07/31/17 1102 07/31/17 1638 07/31/17 2047 08/01/17 0738 08/01/17 1144  GLUCAP 192* 182* 183* 140* 209*   D-Dimer No results for input(s): DDIMER in the last 72 hours. Hgb A1c No results for input(s):  HGBA1C in the last 72 hours. Lipid Profile No results for input(s): CHOL, HDL, LDLCALC, TRIG, CHOLHDL, LDLDIRECT in the last 72 hours. Thyroid function studies No results for input(s): TSH, T4TOTAL, T3FREE, THYROIDAB in the last 72 hours.  Invalid input(s): FREET3 Anemia work up No results for input(s): VITAMINB12, FOLATE, FERRITIN, TIBC, IRON, RETICCTPCT in the last 72 hours. Urinalysis    Component Value Date/Time   COLORURINE YELLOW  07/26/2017 1920   APPEARANCEUR CLOUDY (A) 07/26/2017 1920   LABSPEC 1.014 07/26/2017 1920   PHURINE 5.0 07/26/2017 1920   GLUCOSEU NEGATIVE 07/26/2017 1920   HGBUR SMALL (A) 07/26/2017 1920   BILIRUBINUR NEGATIVE 07/26/2017 1920   KETONESUR NEGATIVE 07/26/2017 1920   PROTEINUR 30 (A) 07/26/2017 1920   NITRITE NEGATIVE 07/26/2017 1920   LEUKOCYTESUR LARGE (A) 07/26/2017 1920   Sepsis Labs Invalid input(s): PROCALCITONIN,  WBC,  LACTICIDVEN Microbiology Recent Results (from the past 240 hour(s))  MRSA PCR Screening     Status: Abnormal   Collection Time: 07/26/17 10:16 PM  Result Value Ref Range Status   MRSA by PCR POSITIVE (A) NEGATIVE Final    Comment:        The GeneXpert MRSA Assay (FDA approved for NASAL specimens only), is one component of a comprehensive MRSA colonization surveillance program. It is not intended to diagnose MRSA infection nor to guide or monitor treatment for MRSA infections. CRITICAL RESULT CALLED TO, READ BACK BY AND VERIFIED WITH: DANIELS,J @ 0134 ON 07/27/17 BY JUW Performed at [facility], [address], [place], [place] [address]   Culture, blood (routine x 2) Call MD if unable to obtain prior to antibiotics being given     Status: None   Collection Time: 07/26/17 10:32 PM  Result Value Ref Range Status   Specimen Description BLOOD RIGHT ARM  Final   Special Requests   Final    BOTTLES DRAWN AEROBIC AND ANAEROBIC Blood Culture adequate volume   Culture   Final    NO GROWTH 5 DAYS Performed at [facility], [address], [place], [place] [address]    Report Status 07/31/2017 FINAL  Final  Culture, blood (routine x 2) Call MD if unable to obtain prior to antibiotics being given     Status: None   Collection Time: 07/26/17 10:39 PM  Result Value Ref Range Status   Specimen Description BLOOD RIGHT ARM  Final   Special Requests   Final    BOTTLES DRAWN AEROBIC AND ANAEROBIC Blood Culture adequate volume   Culture   Final    NO GROWTH 5  DAYS Performed at [facility], [address], [place], [place] [address]    Report Status 07/31/2017 FINAL  Final  Culture, Urine     Status: None   Collection Time: 07/26/17 11:24 PM  Result Value Ref Range Status   Specimen Description   Final    URINE, CATHETERIZED Performed at [facility], [address] [place], [place] [address]    Special Requests   Final    NONE Performed at [facility], [address], [place], [place] [address]    Culture   Final    NO GROWTH Performed at [facility], [address] [place], [place] [address]    Report Status 07/28/2017 FINAL  Final  Culture, blood (Routine X 2) w Reflex to ID Panel     Status: None   Collection Time: 07/27/17  7:35 PM  Result Value Ref Range Status   Specimen Description BLOOD  LEFT HAND  Final   Special Requests   Final    BOTTLES DRAWN AEROBIC AND ANAEROBIC Blood Culture adequate volume   Culture   Final    NO GROWTH 5 DAYS Performed at [facility], [address], [place], [place] [address]    Report Status 08/01/2017 FINAL  Final  Culture, blood (Routine X 2) w Reflex to ID Panel     Status: None   Collection Time: 07/27/17  7:48 PM  Result Value Ref Range Status   Specimen Description BLOOD RIGHT ARM  Final   Special Requests   Final    BOTTLES DRAWN AEROBIC AND ANAEROBIC Blood Culture adequate volume   Culture   Final    NO GROWTH 5 DAYS Performed at [facility], [address] [place], [place] [address]    Report Status 08/01/2017 FINAL  Final     Time coordinating discharge: Over 30 minutes  SIGNED:   Erick Blinks, MD  [facility] 08/01/2017, 12:11 PM Pager   If 7PM-7AM, please contact night-coverage [email or web address] Password [email or web address]

## 2017-08-01 NOTE — Progress Notes (Signed)
Physical Therapy Treatment Patient Details Name: Justin Salazar MRN: [id number] DOB: 25-Nov-1926 Today's Date: 08/01/2017    History of Present Illness Justin Salazar is a 84 y.o. male with a history of chronic C3 vertebral fracture, hypertension, GERD, diabetes type 2, hypothyroidism, coronary artery disease, history of atrial fibrillation not on anticoagulation secondary to falls.  Patient recently hospitalized at an outside hospital due to a candidal UTI.  Patient just completed 10 days of fluconazole.  Patient has been becoming progressively weak over the past 3 days with diminishing appetite.  Patient has had little to eat or drink for the past 3 days and is noticed increasing confusion over that time period.  No palliating or provoking factors.  Symptoms are worsening.  Denies fevers, chills, nausea, vomiting.  Denies chest pain.  Does have occasional dry cough and has not had a bowel movement over the past 6 days    PT Comments    Pt received sitting up in bed, having finished his breakfast tray, cervical collar donned but not tight enough to provide support. Pt assisted with collar adjustment. Pt participates in bed level exercises without complaint, and is converational throughout, pleasant and calm. Pt noted to have soiled the bedding of bowel, nursing team made aware x2, but functional mobility deferred at this time. Upon attempts of Left shoulder/elbow exercise, pt c/o left elbow pain, upon inspection, significant elbow effusion with stage 4 pitting edema: pain with end-range flexion is greatest. Elbow elevated on pillow at end of session. Pt is slightly somnolent, asleep prior to exit at end of session.    Follow Up Recommendations  SNF;Supervision/Assistance - 24 hour     Equipment Recommendations  None recommended by PT    Recommendations for Other Services       Precautions / Restrictions Precautions Precautions: Fall Required Braces or Orthoses: Cervical Brace Cervical  Brace: Hard collar;At all times Restrictions Weight Bearing Restrictions: No    Mobility  Bed Mobility Overal bed mobility: (deferred d/t having had a bowel movement )                Transfers                    Ambulation/Gait                 Stairs            Wheelchair Mobility    Modified Rankin (Stroke Patients Only)       Balance                                            Cognition Arousal/Alertness: Awake/alert Behavior During Therapy: WFL for tasks assessed/performed Overall Cognitive Status: Within Functional Limits for tasks assessed                                        Exercises General Exercises - Upper Extremity Shoulder Flexion: AAROM;Strengthening;Both;10 reps;Supine(limited by left elbow pain, stg 4 pitting edema ) Elbow Flexion: Strengthening;Both;10 reps(AR/ROM) Elbow Extension: Strengthening;Both;10 reps(AR/ROM) General Exercises - Lower Extremity Ankle Circles/Pumps: Supine;15 reps;Both;Strengthening Quad Sets: Supine;Strengthening;Both;15 reps Short Arc Quad: AROM;Strengthening;Supine;Both;15 reps Heel Slides: AAROM;Strengthening;Supine;Both;15 reps Hip ABduction/ADduction: Supine;AROM;Strengthening;Both;15 reps Straight Leg Raises: AAROM;Strengthening;Supine;Both;15 reps Mini-Sqauts: Strengthening;Supine;15 reps;Both(manually resisted in supine)    General Comments  Pertinent Vitals/Pain Pain Assessment: 0-10 Pain Score: 3  Pain Location: neck pain  Pain Descriptors / Indicators: Aching Pain Intervention(s): Limited activity within patient's tolerance;Monitored during session    Home Living                      Prior Function            PT Goals (current goals can now be found in the care plan section) Acute Rehab PT Goals Patient Stated Goal: return home PT Goal Formulation: With patient Time For Goal Achievement: 07/31/17 Potential to Achieve  Goals: Fair Progress towards PT goals: Progressing toward goals    Frequency    Min 3X/week      PT Plan Current plan remains appropriate    Co-evaluation              AM-PAC PT "6 Clicks" Daily Activity  Outcome Measure  Difficulty turning over in bed (including adjusting bedclothes, sheets and blankets)?: A Little Difficulty moving from lying on back to sitting on the side of the bed? : A Lot Difficulty sitting down on and standing up from a chair with arms (e.g., wheelchair, bedside commode, etc,.)?: Unable Help needed moving to and from a bed to chair (including a wheelchair)?: Total Help needed walking in hospital room?: Total Help needed climbing 3-5 steps with a railing? : Total 6 Click Score: 9    End of Session   Activity Tolerance: Patient tolerated treatment well;Patient limited by pain;Other (comment)(needs assistance with BM cleanup ) Patient left: in bed;with call bell/phone within reach Nurse Communication: Mobility status PT Visit Diagnosis: Unsteadiness on feet (R26.81);Other abnormalities of gait and mobility (R26.89);Muscle weakness (generalized) (M62.81)     Time: [id number] PT Time Calculation (min) (ACUTE ONLY): 27 min  Charges:  $Therapeutic Exercise: 23-37 mins                    G Codes:       11:38 AM, 08/01/17 Rosamaria LintsAllan C Amerika Nourse, PT, DPT Physical Therapist - [place] [phone] (Office)   Chynah Orihuela C 08/01/2017, 11:33 AM

## 2017-08-01 NOTE — Care Management Note (Signed)
Case Management Note  Patient Details  Name: Justin Salazar MRN: [id number] Date of Birth: 08/12/1926  Expected Discharge Date:  08/01/17               Expected Discharge Plan:  Home w Home Health Services  In-House Referral:  Clinical Social Work  Discharge planning Services  CM Consult  Post Acute Care Choice:  Home Health, Resumption of Svcs/PTA Provider Choice offered to:  Adult Children  HH Arranged:    RN/PT HH Agency:  [facility]  Status of Service:  Completed, signed off  If discussed at Long Length of Stay Meetings, dates discussed:    Additional Comments: discharging home today. EMS transport has been arranged. Hospice has plan to meet with son next week.   Malcolm Metrohildress, Jaylenne Hamelin Demske, RN 08/01/2017, 12:34 PM

## 2017-09-22 DEATH — deceased

## 2017-11-27 IMAGING — CT CT RENAL STONE PROTOCOL
2 of 3 series · 15 of 46 positions shown, 17 images · non-contrast
Comparison: CT of the chest, abdomen and pelvis performed
02/21/2017

CLINICAL DATA: Acute onset of right back pain. Gross hematuria.
Large amount of white blood cells and white blood cells in the
urine.

EXAM:
CT ABDOMEN AND PELVIS WITHOUT CONTRAST
TECHNIQUE: Multidetector CT imaging of the abdomen and pelvis was performed
following the standard protocol without IV contrast.

[Series 3: lung · axial · 0.70mm/px · z∈[-18,+90]mm · 12 of 63 slices shown, 14 images]
[im 5/63  soft-tissue]
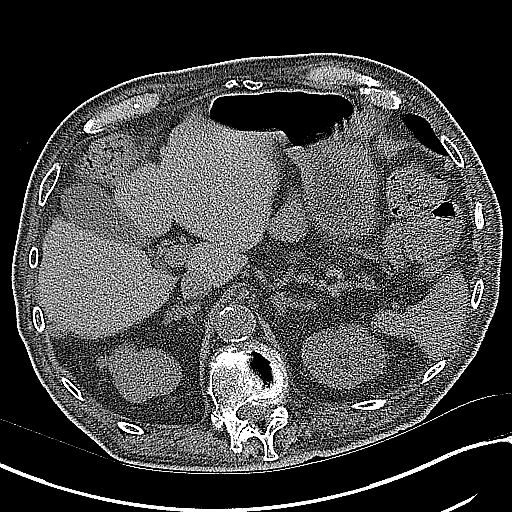
[im 5/63  bone]
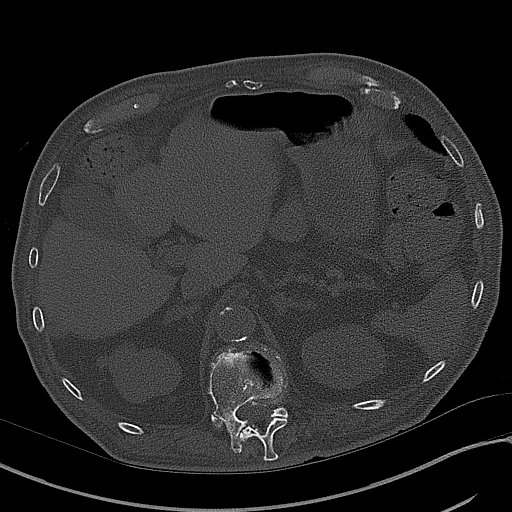
[im 9/63  soft-tissue]
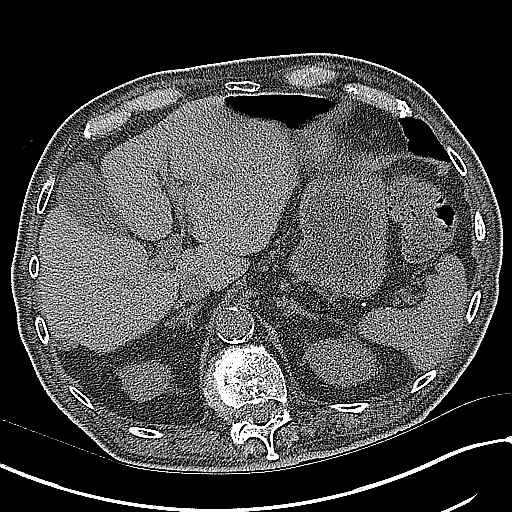
[im 15/63  soft-tissue]
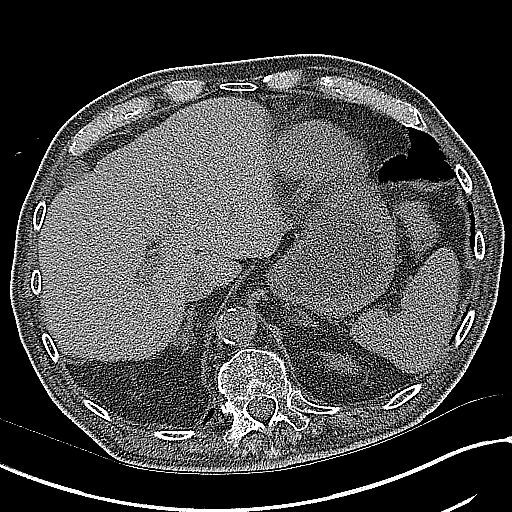
[im 19/63  soft-tissue]
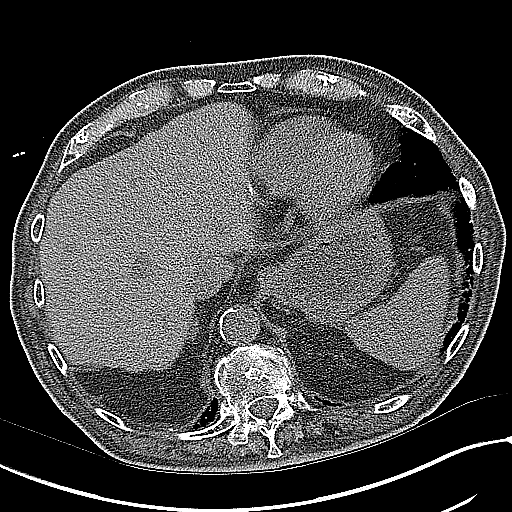
[im 25/63  soft-tissue]
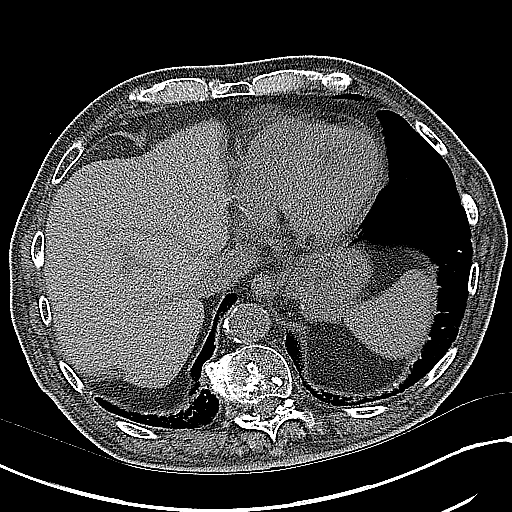
[im 29/63  soft-tissue]
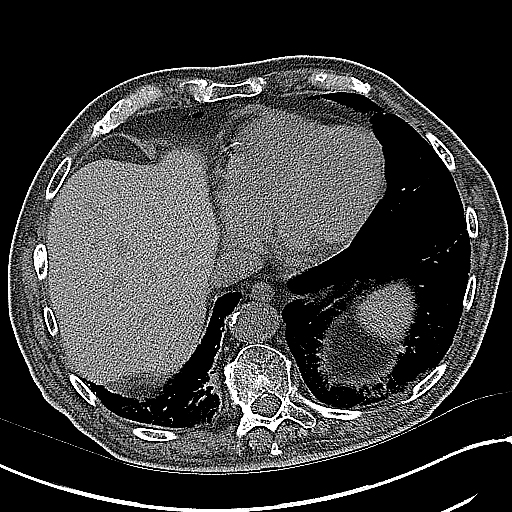
[im 35/63  soft-tissue]
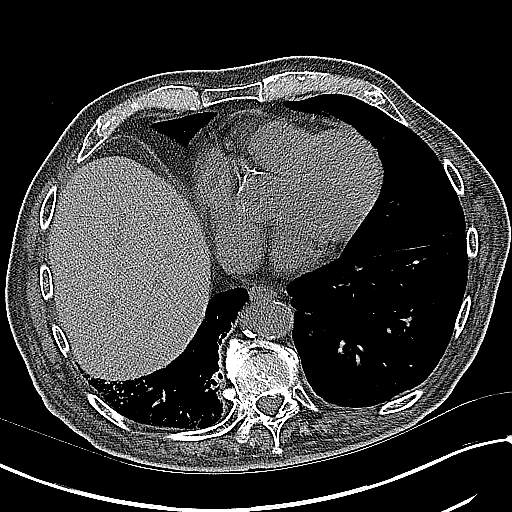
[im 39/63  soft-tissue]
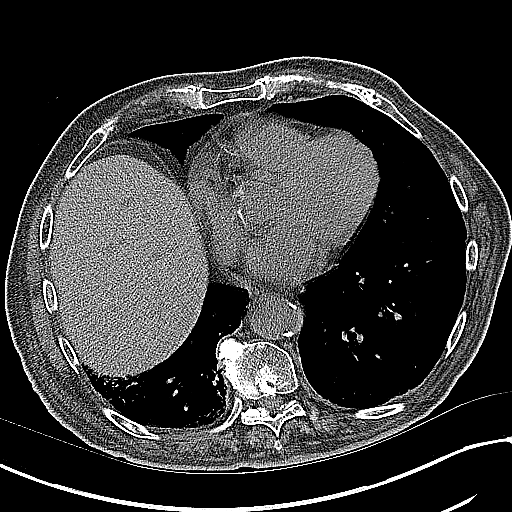
[im 45/63  soft-tissue]
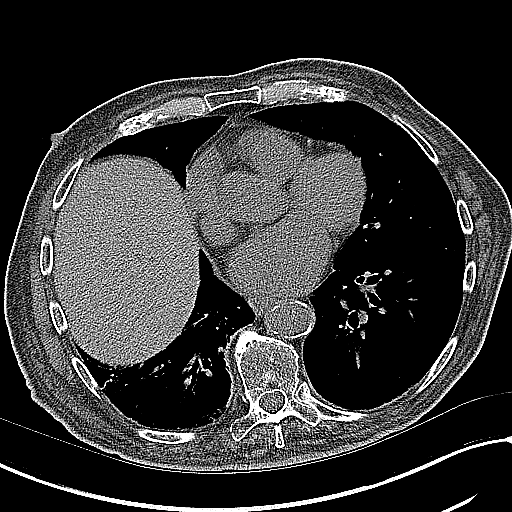
[im 45/63  bone]
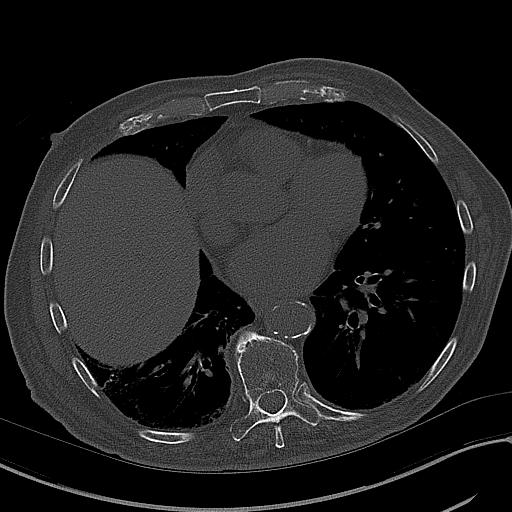
[im 49/63  soft-tissue]
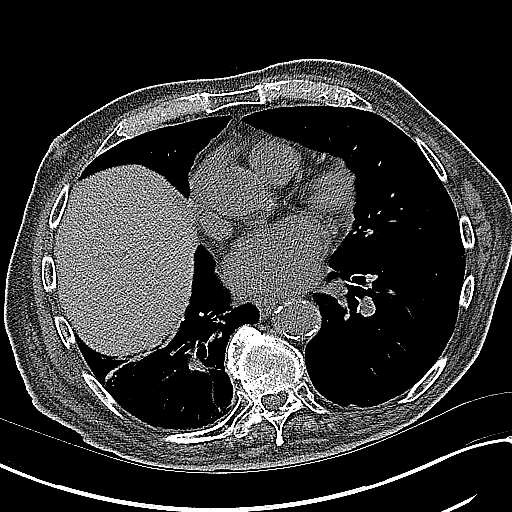
[im 55/63  soft-tissue]
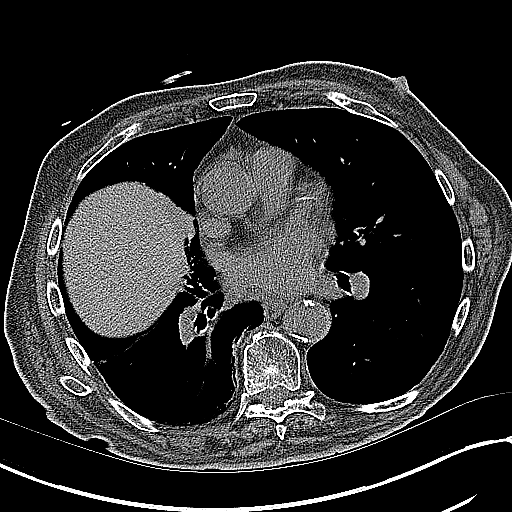
[im 59/63  soft-tissue]
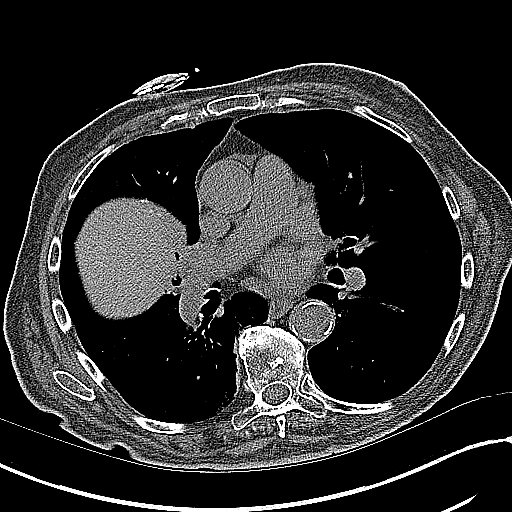

[Series 4: coronal · coronal · 0.68mm/px · 3 of 141 slices shown]
[im 47/141  soft-tissue]
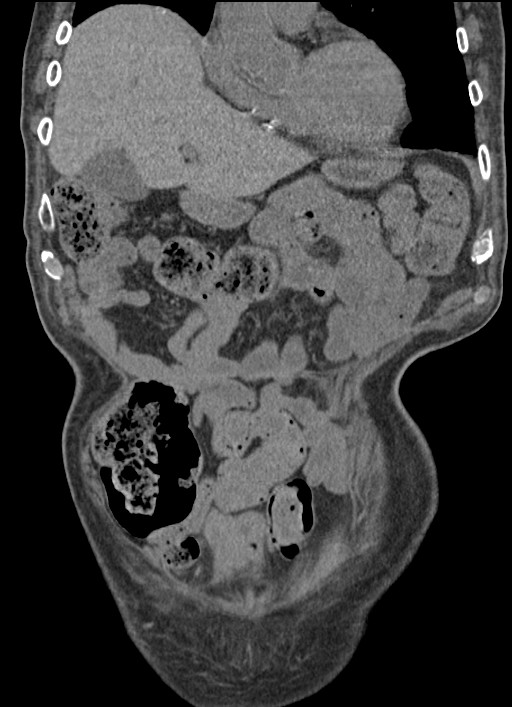
[im 63/141  soft-tissue]
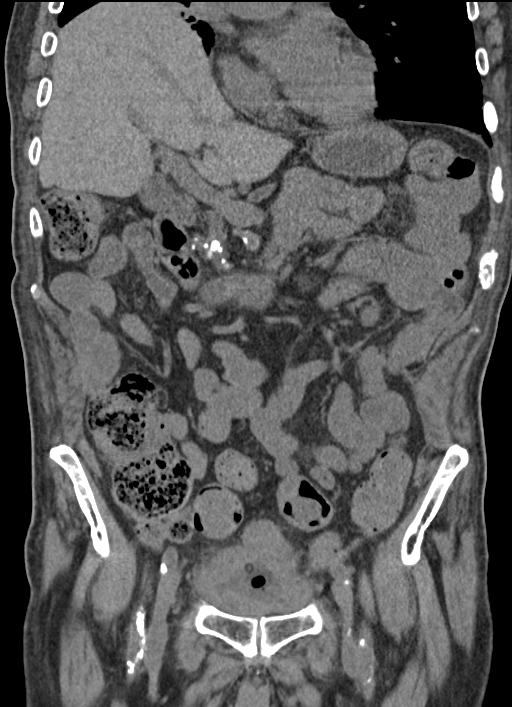
[im 78/141  soft-tissue]
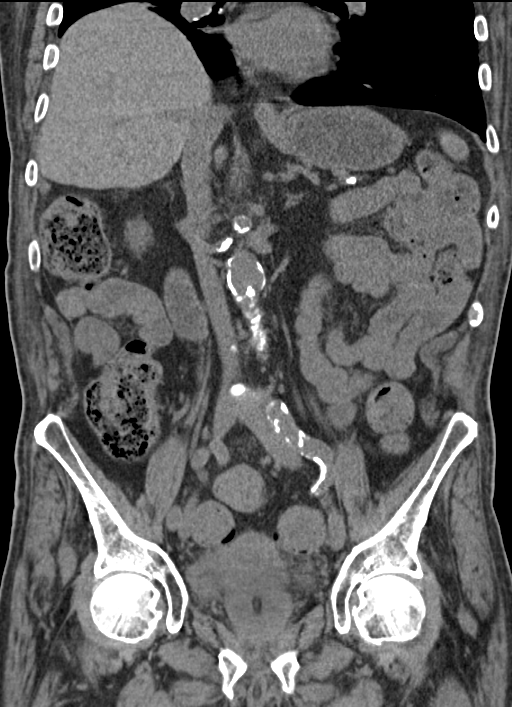

[15 of 46 positions shown; findings below may reference images not displayed]

FINDINGS: Lower chest: Mild bibasilar scarring is noted. Scattered coronary
artery calcifications are seen.

Hepatobiliary: The liver is unremarkable in appearance. The
gallbladder is unremarkable in appearance. The common bile duct
remains normal in caliber.

Pancreas: The pancreas is within normal limits.

Spleen: The spleen is unremarkable in appearance.

Adrenals/Urinary Tract: The adrenal glands are unremarkable in
appearance.

Minimal left-sided hydronephrosis is noted, without evidence of
distal obstruction. Nonspecific perinephric stranding is noted
bilaterally. No renal or ureteral stones are identified.

Stomach/Bowel: The stomach is unremarkable in appearance. The small
bowel is within normal limits. The appendix is not visualized; there
is no evidence for appendicitis. The colon is unremarkable in
appearance.

Vascular/Lymphatic: Diffuse calcification is seen along the
abdominal aorta and its branches. The abdominal aorta is otherwise
grossly unremarkable. The inferior vena cava is grossly
unremarkable. No retroperitoneal lymphadenopathy is seen. No pelvic
sidewall lymphadenopathy is identified.

Reproductive: Marked wall thickening is noted along the bladder,
more prominent anteriorly. This may reflect severe cystitis.
Underlying mass cannot be excluded. The prostate remains normal in
size. A Foley catheter is noted in expected position.

Other: No additional soft tissue abnormalities are seen.

Musculoskeletal: No acute osseous abnormalities are identified.
Lumbar spinal fusion is noted at T12-L2. Multilevel vacuum
phenomenon is noted along the lower thoracic and lumbar spine. The
visualized musculature is unremarkable in appearance.
IMPRESSION: 1. Marked bladder wall thickening, more prominent anteriorly. This
may reflect severe cystitis. Underlying mass cannot be excluded.
Would correlate with the patient's lab findings, and consider
cystoscopy if deemed clinically appropriate.
2. Minimal left-sided hydronephrosis, without evidence of distal
obstruction.
3. Scattered coronary artery calcifications seen.
4. Degenerative change along the lower thoracic and lumbar spine.
Aortic Atherosclerosis ([id number]).
